# Patient Record
Sex: Female | Born: 1985 | Race: White | Hispanic: No | Smoking: Former smoker
Health system: Southern US, Community
[De-identification: ages and names within clinical notes are randomized; demographics above are authoritative.]

## PROBLEM LIST (undated history)

## (undated) ENCOUNTER — Inpatient Hospital Stay (HOSPITAL_COMMUNITY): Payer: Self-pay

## (undated) DIAGNOSIS — L732 Hidradenitis suppurativa: Secondary | ICD-10-CM

## (undated) DIAGNOSIS — O24419 Gestational diabetes mellitus in pregnancy, unspecified control: Secondary | ICD-10-CM

## (undated) DIAGNOSIS — E282 Polycystic ovarian syndrome: Secondary | ICD-10-CM

## (undated) DIAGNOSIS — Z3490 Encounter for supervision of normal pregnancy, unspecified, unspecified trimester: Principal | ICD-10-CM

## (undated) HISTORY — DX: Gestational diabetes mellitus in pregnancy, unspecified control: O24.419

## (undated) HISTORY — PX: TUBE THORACOTOMY: SHX459

## (undated) HISTORY — DX: Polycystic ovarian syndrome: E28.2

## (undated) HISTORY — DX: Hidradenitis suppurativa: L73.2

## (undated) HISTORY — DX: Encounter for supervision of normal pregnancy, unspecified, unspecified trimester: Z34.90

---
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
## 2003-08-09 ENCOUNTER — Ambulatory Visit (HOSPITAL_COMMUNITY): Admission: RE | Admit: 2003-08-09 | Discharge: 2003-08-09 | Payer: Self-pay | Admitting: Family Medicine

## 2003-08-18 ENCOUNTER — Ambulatory Visit (HOSPITAL_COMMUNITY): Admission: RE | Admit: 2003-08-18 | Discharge: 2003-08-18 | Payer: Self-pay | Admitting: Family Medicine

## 2003-08-30 ENCOUNTER — Emergency Department (HOSPITAL_COMMUNITY): Admission: EM | Admit: 2003-08-30 | Discharge: 2003-08-30 | Payer: Self-pay | Admitting: Emergency Medicine

## 2003-08-31 ENCOUNTER — Ambulatory Visit (HOSPITAL_COMMUNITY): Admission: RE | Admit: 2003-08-31 | Discharge: 2003-08-31 | Payer: Self-pay | Admitting: Obstetrics and Gynecology

## 2003-09-16 ENCOUNTER — Encounter: Admission: RE | Admit: 2003-09-16 | Discharge: 2003-09-16 | Payer: Self-pay | Admitting: General Surgery

## 2004-06-22 ENCOUNTER — Other Ambulatory Visit: Admission: RE | Admit: 2004-06-22 | Discharge: 2004-06-22 | Payer: Self-pay | Admitting: Obstetrics and Gynecology

## 2013-01-27 ENCOUNTER — Ambulatory Visit
Admission: RE | Admit: 2013-01-27 | Discharge: 2013-01-27 | Disposition: A | Discharge status: Home / Self Care | Payer: BC Managed Care – PPO | Source: Ambulatory Visit | Attending: Internal Medicine | Admitting: Internal Medicine

## 2013-01-27 ENCOUNTER — Other Ambulatory Visit: Payer: Self-pay | Admitting: Internal Medicine

## 2013-01-27 DIAGNOSIS — M25572 Pain in left ankle and joints of left foot: Secondary | ICD-10-CM

## 2014-01-04 ENCOUNTER — Other Ambulatory Visit: Payer: Self-pay | Admitting: Family Medicine

## 2014-01-04 ENCOUNTER — Ambulatory Visit
Admission: RE | Admit: 2014-01-04 | Discharge: 2014-01-04 | Disposition: A | Discharge status: Home / Self Care | Payer: No Typology Code available for payment source | Source: Ambulatory Visit | Attending: Family Medicine | Admitting: Family Medicine

## 2014-01-04 DIAGNOSIS — R109 Unspecified abdominal pain: Secondary | ICD-10-CM

## 2014-03-22 ENCOUNTER — Ambulatory Visit
Admission: RE | Admit: 2014-03-22 | Discharge: 2014-03-22 | Disposition: A | Discharge status: Home / Self Care | Payer: No Typology Code available for payment source | Source: Ambulatory Visit | Attending: Family Medicine | Admitting: Family Medicine

## 2014-03-22 ENCOUNTER — Other Ambulatory Visit: Payer: Self-pay | Admitting: Family Medicine

## 2014-03-22 DIAGNOSIS — R109 Unspecified abdominal pain: Secondary | ICD-10-CM

## 2014-03-22 DIAGNOSIS — M549 Dorsalgia, unspecified: Secondary | ICD-10-CM

## 2014-10-21 ENCOUNTER — Encounter: Payer: Self-pay | Admitting: Advanced Practice Midwife

## 2014-10-25 ENCOUNTER — Ambulatory Visit (INDEPENDENT_AMBULATORY_CARE_PROVIDER_SITE_OTHER): Payer: Medicaid Other | Admitting: Adult Health

## 2014-10-25 ENCOUNTER — Encounter: Payer: Self-pay | Admitting: Adult Health

## 2014-10-25 VITALS — BP 110/50 | Ht 63.0 in | Wt 210.5 lb

## 2014-10-25 DIAGNOSIS — R040 Epistaxis: Secondary | ICD-10-CM | POA: Diagnosis not present

## 2014-10-25 DIAGNOSIS — Z331 Pregnant state, incidental: Secondary | ICD-10-CM | POA: Diagnosis not present

## 2014-10-25 DIAGNOSIS — Z3490 Encounter for supervision of normal pregnancy, unspecified, unspecified trimester: Secondary | ICD-10-CM

## 2014-10-25 DIAGNOSIS — O09899 Supervision of other high risk pregnancies, unspecified trimester: Secondary | ICD-10-CM | POA: Insufficient documentation

## 2014-10-25 DIAGNOSIS — I998 Other disorder of circulatory system: Secondary | ICD-10-CM | POA: Diagnosis not present

## 2014-10-25 DIAGNOSIS — R200 Anesthesia of skin: Secondary | ICD-10-CM

## 2014-10-25 HISTORY — DX: Encounter for supervision of normal pregnancy, unspecified, unspecified trimester: Z34.90

## 2014-10-25 MED ORDER — PRENATAL PLUS 27-1 MG PO TABS: 1.0000 | ORAL_TABLET | Freq: Every day | ORAL | Status: DC

## 2014-10-25 NOTE — Progress Notes (Addendum)
Subjective:     Patient ID: Anne [name], female   DOB: 10/16/1985, 29 y.o.   MRN: [id number]  HPI Anne [name] is a 29 year old white female in for fluctuations in BP(80/50-140-90), is pregnant.She had a period in October and spotted til December, was seen in [place] and had US,but not sure how far long she is.She has had some nose bleeds and numbness in 3 fingers right hand,using splint at night.She has history of PCO and 4 miscarriages.She is Therapist, sports at [employer].Has pregnancy medicaid.  Review of Systems See HPI Reviewed past medical,surgical, social and family history. Reviewed medications and allergies.     Objective:   Physical Exam BP 110/50 mmHg  Ht 5\' 3"  (1.6 m)  Wt 210 lb 8 oz (95.482 kg)  BMI 37.30 kg/m2  LMP 07/10/2014 (Approximate)FHR 156-160 by doppler.Had [facility] of 462.9 08/20/14.So could be between 12-[redacted] weeks gestation.Had some labs 1/816 TSH 1.190 and ALT 70.Will get in ASAP for [place] and labs.   Discussed not unusual to have nose bleeds around 19-20 weeks and esp with this dry cold air, and can have lower BP in early pregnancy.  Assessment:     Pregnant     Plan:     Rx prenatal plus #30 1 daily with 11 refills Return in 1 day for dating [place] and PN1 labs with CMP Review handout on second trimester and OB packet given Use saline nose spray and humidifier

## 2014-10-25 NOTE — Patient Instructions (Signed)
Second Trimester of Pregnancy The second trimester is from week 13 through week 28, months 4 through 6. The second trimester is often a time when you feel your best. Your body has also adjusted to being pregnant, and you begin to feel better physically. Usually, morning sickness has lessened or quit completely, you may have more energy, and you may have an increase in appetite. The second trimester is also a time when the fetus is growing rapidly. At the end of the sixth month, the fetus is about 9 inches long and weighs about 1 pounds. You will likely begin to feel the baby move (quickening) between 18 and 20 weeks of the pregnancy. BODY CHANGES Your body goes through many changes during pregnancy. The changes vary from woman to woman.   Your weight will continue to increase. You will notice your lower abdomen bulging out.  You may begin to get stretch marks on your hips, abdomen, and breasts.  You may develop headaches that can be relieved by medicines approved by your health care provider.  You may urinate more often because the fetus is pressing on your bladder.  You may develop or continue to have heartburn as a result of your pregnancy.  You may develop constipation because certain hormones are causing the muscles that push waste through your intestines to slow down.  You may develop hemorrhoids or swollen, bulging veins (varicose veins).  You may have back pain because of the weight gain and pregnancy hormones relaxing your joints between the bones in your pelvis and as a result of a shift in weight and the muscles that support your balance.  Your breasts will continue to grow and be tender.  Your gums may bleed and may be sensitive to brushing and flossing.  Dark spots or blotches (chloasma, mask of pregnancy) may develop on your face. This will likely fade after the baby is born.  A dark line from your belly button to the pubic area (linea nigra) may appear. This will likely fade  after the baby is born.  You may have changes in your hair. These can include thickening of your hair, rapid growth, and changes in texture. Some women also have hair loss during or after pregnancy, or hair that feels dry or thin. Your hair will most likely return to normal after your baby is born. WHAT TO EXPECT AT YOUR PRENATAL VISITS During a routine prenatal visit:  You will be weighed to make sure you and the fetus are growing normally.  Your blood pressure will be taken.  Your abdomen will be measured to track your baby's growth.  The fetal heartbeat will be listened to.  Any test results from the previous visit will be discussed. Your health care provider may ask you:  How you are feeling.  If you are feeling the baby move.  If you have had any abnormal symptoms, such as leaking fluid, bleeding, severe headaches, or abdominal cramping.  If you have any questions. Other tests that may be performed during your second trimester include:  Blood tests that check for:  Low iron levels (anemia).  Gestational diabetes (between 24 and 28 weeks).  Rh antibodies.  Urine tests to check for infections, diabetes, or protein in the urine.  An ultrasound to confirm the proper growth and development of the baby.  An amniocentesis to check for possible genetic problems.  Fetal screens for spina bifida and Down syndrome. HOME CARE INSTRUCTIONS   Avoid all smoking, herbs, alcohol, and unprescribed   drugs. These chemicals affect the formation and growth of the baby.  Follow your health care provider's instructions regarding medicine use. There are medicines that are either safe or unsafe to take during pregnancy.  Exercise only as directed by your health care provider. Experiencing uterine cramps is a good sign to stop exercising.  Continue to eat regular, healthy meals.  Wear a good support bra for breast tenderness.  Do not use hot tubs, steam rooms, or saunas.  Wear your  seat belt at all times when driving.  Avoid raw meat, uncooked cheese, cat litter boxes, and soil used by cats. These carry germs that can cause birth defects in the baby.  Take your prenatal vitamins.  Try taking a stool softener (if your health care provider approves) if you develop constipation. Eat more high-fiber foods, such as fresh vegetables or fruit and whole grains. Drink plenty of fluids to keep your urine clear or pale yellow.  Take warm sitz baths to soothe any pain or discomfort caused by hemorrhoids. Use hemorrhoid cream if your health care provider approves.  If you develop varicose veins, wear support hose. Elevate your feet for 15 minutes, 3-4 times a day. Limit salt in your diet.  Avoid heavy lifting, wear low heel shoes, and practice good posture.  Rest with your legs elevated if you have leg cramps or low back pain.  Visit your dentist if you have not gone yet during your pregnancy. Use a soft toothbrush to brush your teeth and be gentle when you floss.  A sexual relationship may be continued unless your health care provider directs you otherwise.  Continue to go to all your prenatal visits as directed by your health care provider. SEEK MEDICAL CARE IF:   You have dizziness.  You have mild pelvic cramps, pelvic pressure, or nagging pain in the abdominal area.  You have persistent nausea, vomiting, or diarrhea.  You have a bad smelling vaginal discharge.  You have pain with urination. SEEK IMMEDIATE MEDICAL CARE IF:   You have a fever.  You are leaking fluid from your vagina.  You have spotting or bleeding from your vagina.  You have severe abdominal cramping or pain.  You have rapid weight gain or loss.  You have shortness of breath with chest pain.  You notice sudden or extreme swelling of your face, hands, ankles, feet, or legs.  You have not felt your baby move in over an hour.  You have severe headaches that do not go away with  medicine.  You have vision changes. Document Released: 09/18/2001 Document Revised: 09/29/2013 Document Reviewed: 11/25/2012 [facility] Patient Information 2015 [place], [place]. This information is not intended to replace advice given to you by your health care provider. Make sure you discuss any questions you have with your health care provider. Return in 1 day for [place]

## 2014-10-26 ENCOUNTER — Ambulatory Visit (INDEPENDENT_AMBULATORY_CARE_PROVIDER_SITE_OTHER): Payer: Medicaid Other

## 2014-10-26 ENCOUNTER — Other Ambulatory Visit: Payer: Self-pay | Admitting: Adult Health

## 2014-10-26 ENCOUNTER — Other Ambulatory Visit: Payer: Medicaid Other

## 2014-10-26 ENCOUNTER — Encounter: Payer: Self-pay | Admitting: Adult Health

## 2014-10-26 DIAGNOSIS — O2621 Pregnancy care for patient with recurrent pregnancy loss, first trimester: Secondary | ICD-10-CM

## 2014-10-26 DIAGNOSIS — Z3481 Encounter for supervision of other normal pregnancy, first trimester: Secondary | ICD-10-CM

## 2014-10-26 DIAGNOSIS — O26841 Uterine size-date discrepancy, first trimester: Secondary | ICD-10-CM

## 2014-10-26 DIAGNOSIS — Z3682 Encounter for antenatal screening for nuchal translucency: Secondary | ICD-10-CM

## 2014-10-26 DIAGNOSIS — Z331 Pregnant state, incidental: Secondary | ICD-10-CM

## 2014-10-26 DIAGNOSIS — Z0184 Encounter for antibody response examination: Secondary | ICD-10-CM

## 2014-10-26 DIAGNOSIS — Z3490 Encounter for supervision of normal pregnancy, unspecified, unspecified trimester: Secondary | ICD-10-CM

## 2014-10-26 DIAGNOSIS — Z113 Encounter for screening for infections with a predominantly sexual mode of transmission: Secondary | ICD-10-CM

## 2014-10-26 DIAGNOSIS — Z114 Encounter for screening for human immunodeficiency virus [HIV]: Secondary | ICD-10-CM

## 2014-10-26 DIAGNOSIS — Z36 Encounter for antenatal screening of mother: Secondary | ICD-10-CM

## 2014-10-26 LAB — COMPREHENSIVE METABOLIC PANEL
ALT: 47 U/L — ABNORMAL HIGH (ref 0–35)
AST: 32 U/L (ref 0–37)
Albumin: 3.6 g/dL (ref 3.5–5.2)
Alkaline Phosphatase: 47 U/L (ref 39–117)
BUN: 8 mg/dL (ref 6–23)
CO2: 22 mEq/L (ref 19–32)
Calcium: 9 mg/dL (ref 8.4–10.5)
Chloride: 105 mEq/L (ref 96–112)
Creat: 0.55 mg/dL (ref 0.50–1.10)
Glucose, Bld: 77 mg/dL (ref 70–99)
Potassium: 4 mEq/L (ref 3.5–5.3)
Sodium: 136 mEq/L (ref 135–145)
Total Bilirubin: 0.2 mg/dL (ref 0.2–1.2)
Total Protein: 6.4 g/dL (ref 6.0–8.3)

## 2014-10-26 NOTE — Progress Notes (Signed)
U/S-single IUP with +FCA noted, FHR-157 bpm, CRL c/w 14+1wks EDD 04/25/2015,  cx appears closed, bilateral adnexa appears WNL, PT desires NT/IT screen, NB present, NT-1.76mm, anterior Gr 0 placenta

## 2014-10-27 ENCOUNTER — Telehealth: Payer: Self-pay | Admitting: Adult Health

## 2014-10-27 LAB — RPR

## 2014-10-27 LAB — CBC
HCT: 40.6 % (ref 36.0–46.0)
Hemoglobin: 13.4 g/dL (ref 12.0–15.0)
MCH: 30 pg (ref 26.0–34.0)
MCHC: 33 g/dL (ref 30.0–36.0)
MCV: 90.8 fL (ref 78.0–100.0)
MPV: 10.4 fL (ref 8.6–12.4)
Platelets: 246 10*3/uL (ref 150–400)
RBC: 4.47 MIL/uL (ref 3.87–5.11)
RDW: 13.9 % (ref 11.5–15.5)
WBC: 13.2 10*3/uL — ABNORMAL HIGH (ref 4.0–10.5)

## 2014-10-27 LAB — ABO AND RH: Rh Type: POSITIVE

## 2014-10-27 LAB — HEPATITIS B SURFACE ANTIGEN: Hepatitis B Surface Ag: NEGATIVE

## 2014-10-27 LAB — HIV ANTIBODY (ROUTINE TESTING W REFLEX): HIV 1&2 Ab, 4th Generation: NONREACTIVE

## 2014-10-27 LAB — ANTIBODY SCREEN: Antibody Screen: NEGATIVE

## 2014-10-27 LAB — VARICELLA ZOSTER ANTIBODY, IGG: Varicella IgG: 339.5 Index — ABNORMAL HIGH (ref <135.00)

## 2014-10-27 LAB — RUBELLA SCREEN: Rubella: 1.53 Index — ABNORMAL HIGH (ref <0.90)

## 2014-10-27 NOTE — Telephone Encounter (Signed)
Pt aware of LFTs, level better at 47

## 2014-10-29 LAB — US OB COMP + 14 WK

## 2014-11-02 LAB — MATERNAL SCREEN, INTEGRATED #1

## 2014-11-08 ENCOUNTER — Encounter: Payer: Self-pay | Admitting: Women's Health

## 2014-11-08 ENCOUNTER — Ambulatory Visit (INDEPENDENT_AMBULATORY_CARE_PROVIDER_SITE_OTHER): Payer: Medicaid Other | Admitting: Women's Health

## 2014-11-08 VITALS — BP 122/70 | Wt 213.0 lb

## 2014-11-08 DIAGNOSIS — Z1389 Encounter for screening for other disorder: Secondary | ICD-10-CM | POA: Diagnosis not present

## 2014-11-08 DIAGNOSIS — R740 Nonspecific elevation of levels of transaminase and lactic acid dehydrogenase [LDH]: Secondary | ICD-10-CM

## 2014-11-08 DIAGNOSIS — Z0283 Encounter for blood-alcohol and blood-drug test: Secondary | ICD-10-CM

## 2014-11-08 DIAGNOSIS — E282 Polycystic ovarian syndrome: Secondary | ICD-10-CM | POA: Insufficient documentation

## 2014-11-08 DIAGNOSIS — Z331 Pregnant state, incidental: Secondary | ICD-10-CM | POA: Diagnosis not present

## 2014-11-08 DIAGNOSIS — Z118 Encounter for screening for other infectious and parasitic diseases: Secondary | ICD-10-CM

## 2014-11-08 DIAGNOSIS — Z3492 Encounter for supervision of normal pregnancy, unspecified, second trimester: Secondary | ICD-10-CM

## 2014-11-08 DIAGNOSIS — Z1159 Encounter for screening for other viral diseases: Secondary | ICD-10-CM

## 2014-11-08 DIAGNOSIS — R7401 Elevation of levels of liver transaminase levels: Secondary | ICD-10-CM | POA: Insufficient documentation

## 2014-11-08 DIAGNOSIS — Z3682 Encounter for antenatal screening for nuchal translucency: Secondary | ICD-10-CM

## 2014-11-08 DIAGNOSIS — Z363 Encounter for antenatal screening for malformations: Secondary | ICD-10-CM

## 2014-11-08 DIAGNOSIS — Z1371 Encounter for nonprocreative screening for genetic disease carrier status: Secondary | ICD-10-CM

## 2014-11-08 LAB — POCT URINALYSIS DIPSTICK
Blood, UA: NEGATIVE
Glucose, UA: NEGATIVE
Ketones, UA: NEGATIVE
Leukocytes, UA: NEGATIVE
Nitrite, UA: NEGATIVE
Protein, UA: NEGATIVE

## 2014-11-08 NOTE — Progress Notes (Addendum)
Subjective:  Anne [name] is a 29 y.o. G59P0040 Caucasian female at [redacted]w[redacted]d by 14wk u/s, being seen today for her first obstetrical visit.  She had early u/s w/ provider in [place], then transferred to [place]. Her obstetrical history is significant for sab x 4, pcos, elevated ALT (70 @ PCP in early Jan, 47 here 2 wks ago). PCP was thinking elevated ALT could possibly be gallbladder. No pain, n/v at this time. Both of her sisters had GDM. Pregnancy history fully reviewed.  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 Occ HAs. Reflux. Denies vb, cramping, uti s/s, abnormal/malodorous vag d/c, or vulvovaginal itching/irritation.  BP 122/70 mmHg  Wt 213 lb (96.616 kg)  LMP 07/10/2014 (Approximate) Body mass index is 37.74 kg/(m^2).   HISTORY: OB History  Gravida Para Term Preterm AB SAB TAB Ectopic Multiple Living  5    4 4         # Outcome Date GA Lbr Len/2nd Weight Sex Delivery Anes PTL Lv  5 Current           4 SAB           3 SAB           2 SAB           1 SAB              Past Medical History  Diagnosis Date  . PCOS (polycystic ovarian syndrome)   . Hidradenitis suppurativa   . Pregnant 10/25/2014   Past Surgical History  Procedure Laterality Date  . Tube thoracotomy     Family History  Problem Relation Age of Onset  . Cancer Mother     thyroid  . Diabetes Mother   . Hypertension Mother   . Cancer Father     pancreatic  . Gestational diabetes Sister   . Cancer Brother     melonoma on face  . Asthma Maternal Grandmother   . Diabetes Maternal Grandfather   . Other Paternal Grandfather     blood clot  . Gestational diabetes Sister   . Polycystic ovary syndrome Sister     Exam   System:     General: Well developed & nourished, no acute distress   Skin: Warm & dry, normal coloration and turgor, no rashes   Neurologic: Alert & oriented, normal mood   Cardiovascular: Regular rate & rhythm   Respiratory: Effort & rate normal, LCTAB, acyanotic   Abdomen: Soft, non tender   Extremities: normal strength, tone  Thin prep pap smear 2014 @ [facility]: neg  FHR: 158 via doppler   Assessment:   Pregnancy: G5P0040 Patient Active Problem List   Diagnosis Date Noted  . Supervision of normal pregnancy 10/25/2014    Priority: High  . Elevated ALT measurement 11/08/2014  . PCOS (polycystic ovarian syndrome) 11/08/2014    [redacted]w[redacted]d G5P0040 New OB visit PCOS Strong family h/o GDM BMI 37.74 Recent elevated ALT  Plan:  Initial labs drawn at previous visit, will send urine studies today Continue prenatal vitamins Problem list reviewed and updated Reviewed n/v relief measures and warning s/s to report Reviewed recommended weight gain based on pre-gravid BMI Encouraged well-balanced diet Genetic Screening discussed Integrated Screen: 1st IT/NT 2wks ago, needs 2nd IT Cystic fibrosis screening discussed requested Ultrasound discussed; fetal survey: requested Follow up in asap for early 2hr gtt, then 4 weeks for visit and anatomy u/s Go to [facility]  today for 2nd IT and CF (since 1st IT w/ [facility]) CCNC completed Continue wrist splint for suspected carpal tunnel Rt hand Increase fluids, small frequent meals/snacks for headaches TUMS for reflux and reflux info printed Had flu shot at work in Nov 2015  Tawnya Crook CNM, [facility] 11/08/2014 2:41 PM

## 2014-11-08 NOTE — Patient Instructions (Addendum)
You will have your sugar test next visit.  Please do not eat or drink anything after midnight the night before you come, not even water.  You will be here for at least two hours.     Second Trimester of Pregnancy The second trimester is from week 13 through week 28, months 4 through 6. The second trimester is often a time when you feel your best. Your body has also adjusted to being pregnant, and you begin to feel better physically. Usually, morning sickness has lessened or quit completely, you may have more energy, and you may have an increase in appetite. The second trimester is also a time when the fetus is growing rapidly. At the end of the sixth month, the fetus is about 9 inches long and weighs about 1 pounds. You will likely begin to feel the baby move (quickening) between 18 and 20 weeks of the pregnancy. BODY CHANGES Your body goes through many changes during pregnancy. The changes vary from woman to woman.   Your weight will continue to increase. You will notice your lower abdomen bulging out.  You may begin to get stretch marks on your hips, abdomen, and breasts.  You may develop headaches that can be relieved by medicines approved by your health care provider.  You may urinate more often because the fetus is pressing on your bladder.  You may develop or continue to have heartburn as a result of your pregnancy.  You may develop constipation because certain hormones are causing the muscles that push waste through your intestines to slow down.  You may develop hemorrhoids or swollen, bulging veins (varicose veins).  You may have back pain because of the weight gain and pregnancy hormones relaxing your joints between the bones in your pelvis and as a result of a shift in weight and the muscles that support your balance.  Your breasts will continue to grow and be tender.  Your gums may bleed and may be sensitive to brushing and flossing.  Dark spots or blotches (chloasma, mask of  pregnancy) may develop on your face. This will likely fade after the baby is born.  A dark line from your belly button to the pubic area (linea nigra) may appear. This will likely fade after the baby is born.  You may have changes in your hair. These can include thickening of your hair, rapid growth, and changes in texture. Some women also have hair loss during or after pregnancy, or hair that feels dry or thin. Your hair will most likely return to normal after your baby is born. WHAT TO EXPECT AT YOUR PRENATAL VISITS During a routine prenatal visit:  You will be weighed to make sure you and the fetus are growing normally.  Your blood pressure will be taken.  Your abdomen will be measured to track your baby's growth.  The fetal heartbeat will be listened to.  Any test results from the previous visit will be discussed. Your health care provider may ask you:  How you are feeling.  If you are feeling the baby move.  If you have had any abnormal symptoms, such as leaking fluid, bleeding, severe headaches, or abdominal cramping.  If you have any questions. Other tests that may be performed during your second trimester include:  Blood tests that check for:  Low iron levels (anemia).  Gestational diabetes (between 24 and 28 weeks).  Rh antibodies.  Urine tests to check for infections, diabetes, or protein in the urine.  An ultrasound  to confirm the proper growth and development of the baby.  An amniocentesis to check for possible genetic problems.  Fetal screens for spina bifida and Down syndrome. HOME CARE INSTRUCTIONS   Avoid all smoking, herbs, alcohol, and unprescribed drugs. These chemicals affect the formation and growth of the baby.  Follow your health care provider's instructions regarding medicine use. There are medicines that are either safe or unsafe to take during pregnancy.  Exercise only as directed by your health care provider. Experiencing uterine cramps is a  good sign to stop exercising.  Continue to eat regular, healthy meals.  Wear a good support bra for breast tenderness.  Do not use hot tubs, steam rooms, or saunas.  Wear your seat belt at all times when driving.  Avoid raw meat, uncooked cheese, cat litter boxes, and soil used by cats. These carry germs that can cause birth defects in the baby.  Take your prenatal vitamins.  Try taking a stool softener (if your health care provider approves) if you develop constipation. Eat more high-fiber foods, such as fresh vegetables or fruit and whole grains. Drink plenty of fluids to keep your urine clear or pale yellow.  Take warm sitz baths to soothe any pain or discomfort caused by hemorrhoids. Use hemorrhoid cream if your health care provider approves.  If you develop varicose veins, wear support hose. Elevate your feet for 15 minutes, 3-4 times a day. Limit salt in your diet.  Avoid heavy lifting, wear low heel shoes, and practice good posture.  Rest with your legs elevated if you have leg cramps or low back pain.  Visit your dentist if you have not gone yet during your pregnancy. Use a soft toothbrush to brush your teeth and be gentle when you floss.  A sexual relationship may be continued unless your health care provider directs you otherwise.  Continue to go to all your prenatal visits as directed by your health care provider. SEEK MEDICAL CARE IF:   You have dizziness.  You have mild pelvic cramps, pelvic pressure, or nagging pain in the abdominal area.  You have persistent nausea, vomiting, or diarrhea.  You have a bad smelling vaginal discharge.  You have pain with urination. SEEK IMMEDIATE MEDICAL CARE IF:   You have a fever.  You are leaking fluid from your vagina.  You have spotting or bleeding from your vagina.  You have severe abdominal cramping or pain.  You have rapid weight gain or loss.  You have shortness of breath with chest pain.  You notice sudden  or extreme swelling of your face, hands, ankles, feet, or legs.  You have not felt your baby move in over an hour.  You have severe headaches that do not go away with medicine.  You have vision changes. Document Released: 09/18/2001 Document Revised: 09/29/2013 Document Reviewed: 11/25/2012 [facility] Patient Information 2015 [place], [place]. This information is not intended to replace advice given to you by your health care provider. Make sure you discuss any questions you have with your health care provider.  Heartburn During Pregnancy  Heartburn is a burning sensation in the chest caused by stomach acid backing up into the esophagus. Heartburn is common in pregnancy because a certain hormone (progesterone) is released when a woman is pregnant. The progesterone hormone may relax the valve that separates the esophagus from the stomach. This allows acid to go up into the esophagus, causing heartburn. Heartburn may also happen in pregnancy because the enlarging uterus pushes up on the  stomach, which pushes more acid into the esophagus. This is especially true in the later stages of pregnancy. Heartburn problems usually go away after giving birth. CAUSES  Heartburn is caused by stomach acid backing up into the esophagus. During pregnancy, this may result from various things, including:  The progesterone hormone. Changing hormone levels. The growing uterus pushing stomach acid upward. Large meals. Certain foods and drinks. Exercise. Increased acid production. SIGNS AND SYMPTOMS  Burning pain in the chest or lower throat. Bitter taste in the mouth. Coughing. DIAGNOSIS  Your health care provider will typically diagnose heartburn by taking a careful history of your concern. Blood tests may be done to check for a certain type of bacteria that is associated with heartburn. Sometimes, heartburn is diagnosed by prescribing a heartburn medicine to see if the symptoms improve. In some cases, a procedure  called an endoscopy may be done. In this procedure, a tube with a light and a camera on the end (endoscope) is used to examine the esophagus and the stomach. TREATMENT  Treatment will vary depending on the severity of your symptoms. Your health care provider may recommend: Over-the-counter medicines (antacids, acid reducers) for mild heartburn. Prescription medicines to decrease stomach acid or to protect your stomach lining. Certain changes in your diet. Elevating the head of your bed by putting blocks under the legs. This helps prevent stomach acid from backing up into the esophagus when you are lying down. HOME CARE INSTRUCTIONS  Only take over-the-counter or prescription medicines as directed by your health care provider. Raise the head of your bed by putting blocks under the legs if instructed to do so by your health care provider. Sleeping with more pillows is not effective because it only changes the position of your head. Do not exercise right after eating. Avoid eating 2-3 hours before bed. Do not lie down right after eating. Eat small meals throughout the day instead of three large meals. Identify foods and beverages that make your symptoms worse and avoid them. Foods you may want to avoid include: Peppers. Chocolate. High-fat foods, including fried foods. Spicy foods. Garlic and onions. Citrus fruits, including oranges, grapefruit, lemons, and limes. Food containing tomatoes or tomato products. Mint. Carbonated and caffeinated drinks. Vinegar. SEEK MEDICAL CARE IF: You have abdominal pain of any kind. You feel burning in your upper abdomen or chest, especially after eating or lying down. You have nausea and vomiting. Your stomach feels upset after you eat. SEEK IMMEDIATE MEDICAL CARE IF:  You have severe chest pain that goes down your arm or into your jaw or neck. You feel sweaty, dizzy, or light-headed. You become short of breath. You vomit blood. You have difficulty or  pain with swallowing. You have bloody or black, tarry stools. You have episodes of heartburn more than 3 times a week, for more than 2 weeks. MAKE SURE YOU: Understand these instructions. Will watch your condition. Will get help right away if you are not doing well or get worse. Document Released: 09/21/2000 Document Revised: 09/29/2013 Document Reviewed: 05/13/2013 [facility] Patient Information 2015 [place], [place]. This information is not intended to replace advice given to you by your health care provider. Make sure you discuss any questions you have with your health care provider.

## 2014-11-09 LAB — PMP SCREEN PROFILE (10S), URINE
Amphetamine Screen, Ur: NEGATIVE ng/mL
Barbiturate Screen, Ur: NEGATIVE ng/mL
Benzodiazepine Screen, Urine: NEGATIVE ng/mL
Cannabinoids Ur Ql Scn: NEGATIVE ng/mL
Cocaine(Metab.)Screen, Urine: NEGATIVE ng/mL
Creatinine(Crt), U: 163.4 mg/dL (ref 20.0–300.0)
Methadone Scn, Ur: NEGATIVE ng/mL
Opiate Scrn, Ur: NEGATIVE ng/mL
Oxycodone+Oxymorphone Ur Ql Scn: NEGATIVE ng/mL
PCP Scrn, Ur: NEGATIVE ng/mL
Ph of Urine: 5.3 (ref 4.5–8.9)
Propoxyphene, Screen: NEGATIVE ng/mL

## 2014-11-09 LAB — URINALYSIS, ROUTINE W REFLEX MICROSCOPIC
Bilirubin, UA: NEGATIVE
Glucose, UA: NEGATIVE
Ketones, UA: NEGATIVE
Leukocytes, UA: NEGATIVE
Nitrite, UA: NEGATIVE
Protein, UA: NEGATIVE
RBC, UA: NEGATIVE
Specific Gravity, UA: 1.028 (ref 1.005–1.030)
Urobilinogen, Ur: 0.2 mg/dL (ref 0.2–1.0)
pH, UA: 5.5 (ref 5.0–7.5)

## 2014-11-10 LAB — GC/CHLAMYDIA PROBE AMP
CHLAMYDIA, DNA PROBE: NEGATIVE
Neisseria gonorrhoeae by PCR: NEGATIVE

## 2014-11-10 LAB — URINE CULTURE: Organism ID, Bacteria: NO GROWTH

## 2014-11-11 LAB — MATERNAL SCREEN, INTEGRATED #2
AFP MoM: 1.11
AFP, Serum: 27.8 ng/mL
Age risk Down Syndrome: 1:680 {titer}
Calculated Gestational Age: 15.7
Crown Rump Length: 83.1 mm
Estriol Mom: 0.69
Estriol, Free: 0.46 ng/mL
Inhibin A Dimeric: 235 pg/mL
Inhibin A MoM: 1.59
MSS Down Syndrome: <1:5000 {titer}
MSS Trisomy 18 Risk: <1:5000 {titer}
NT MoM: 0.95
Nuchal Translucency: 1.72 mm
Number of fetuses: 1
PAPP-A MoM: 1.71
PAPP-A: 1094 ng/mL
Rish for ONTD: 1:4600 {titer}
hCG MoM: 0.77
hCG, Serum: 23.7 IU/mL

## 2014-11-16 LAB — CYSTIC FIBROSIS DIAGNOSTIC STUDY: Result Summary:: DETECTED — AB

## 2014-11-17 ENCOUNTER — Encounter: Payer: Self-pay | Admitting: Women's Health

## 2014-11-17 ENCOUNTER — Telehealth: Payer: Self-pay | Admitting: Women's Health

## 2014-11-17 DIAGNOSIS — O09899 Supervision of other high risk pregnancies, unspecified trimester: Secondary | ICD-10-CM | POA: Insufficient documentation

## 2014-11-17 DIAGNOSIS — Z141 Cystic fibrosis carrier: Secondary | ICD-10-CM

## 2014-11-17 NOTE — Telephone Encounter (Signed)
Calling to notify pt of +CF carrier status and FOB needs to be tested. No answer and unable to leave voicemail.  Roma Schanz, CNM, WHNP-BC 11/17/2014 1:23 PM

## 2014-11-19 ENCOUNTER — Other Ambulatory Visit: Payer: Medicaid Other

## 2014-11-19 ENCOUNTER — Telehealth: Payer: Self-pay | Admitting: Obstetrics and Gynecology

## 2014-11-19 DIAGNOSIS — Z0184 Encounter for antibody response examination: Secondary | ICD-10-CM

## 2014-11-19 DIAGNOSIS — Z3482 Encounter for supervision of other normal pregnancy, second trimester: Secondary | ICD-10-CM

## 2014-11-19 DIAGNOSIS — Z331 Pregnant state, incidental: Secondary | ICD-10-CM

## 2014-11-19 DIAGNOSIS — Z113 Encounter for screening for infections with a predominantly sexual mode of transmission: Secondary | ICD-10-CM

## 2014-11-19 DIAGNOSIS — Z131 Encounter for screening for diabetes mellitus: Secondary | ICD-10-CM

## 2014-11-19 DIAGNOSIS — Z114 Encounter for screening for human immunodeficiency virus [HIV]: Secondary | ICD-10-CM

## 2014-11-19 NOTE — Telephone Encounter (Signed)
Pt informed will only need the 2 GTT glucose testing for her [facility] appt. Pt verbalized understanding.

## 2014-11-23 ENCOUNTER — Other Ambulatory Visit: Payer: Self-pay | Admitting: Obstetrics & Gynecology

## 2014-11-24 LAB — GLUCOSE TOLERANCE, 2 HOURS W/ 1HR
GLUCOSE, FASTING: 93 mg/dL — AB (ref 65–91)
Glucose, 1 hour: 177 mg/dL (ref 65–179)
Glucose, 2 hour: 72 mg/dL (ref 65–152)

## 2014-11-29 ENCOUNTER — Telehealth: Payer: Self-pay | Admitting: Women's Health

## 2014-11-29 DIAGNOSIS — Z141 Cystic fibrosis carrier: Principal | ICD-10-CM

## 2014-11-29 DIAGNOSIS — O09892 Supervision of other high risk pregnancies, second trimester: Secondary | ICD-10-CM

## 2014-11-29 NOTE — Telephone Encounter (Signed)
Still unsuccessful at trying to reach pt to notify of +CF carrier status. Will notify at next visit on 2/29 if unable to contact prior to then.  Roma Schanz, CNM, WHNP-BC 11/29/2014 2:00 PM

## 2014-11-30 ENCOUNTER — Telehealth: Payer: Self-pay | Admitting: Women's Health

## 2014-11-30 NOTE — Telephone Encounter (Signed)
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  Pt also inquired about GTT and was informed of normal results.

## 2014-12-03 ENCOUNTER — Telehealth: Payer: Self-pay | Admitting: Obstetrics & Gynecology

## 2014-12-03 NOTE — Telephone Encounter (Signed)
Pt states she had pain in right wrist, hand and arm, has improved throughout the day but continues to have numbness, some swelling, no redness or heat. Pt states does have carpal tunnel and has been wearing her arm brace. Pt also stated they were busy at her work place last week and felt the pain could be from "oveuse and repetitive use." Informed pt felt like it was due to the carpal tunnel to continue to wear brace if pain gets worse to call our office back or go to [facility] if our office is closed. Pt verbalized understanding. Pt unable to take tylenol due to elevated LFT's.

## 2014-12-06 ENCOUNTER — Ambulatory Visit (INDEPENDENT_AMBULATORY_CARE_PROVIDER_SITE_OTHER): Payer: Medicaid Other

## 2014-12-06 ENCOUNTER — Encounter: Payer: Self-pay | Admitting: Obstetrics and Gynecology

## 2014-12-06 ENCOUNTER — Other Ambulatory Visit: Payer: Self-pay | Admitting: Women's Health

## 2014-12-06 ENCOUNTER — Ambulatory Visit (INDEPENDENT_AMBULATORY_CARE_PROVIDER_SITE_OTHER): Payer: Medicaid Other | Admitting: Obstetrics and Gynecology

## 2014-12-06 DIAGNOSIS — Z1389 Encounter for screening for other disorder: Secondary | ICD-10-CM

## 2014-12-06 DIAGNOSIS — O2622 Pregnancy care for patient with recurrent pregnancy loss, second trimester: Secondary | ICD-10-CM

## 2014-12-06 DIAGNOSIS — Z363 Encounter for antenatal screening for malformations: Secondary | ICD-10-CM

## 2014-12-06 DIAGNOSIS — Z331 Pregnant state, incidental: Secondary | ICD-10-CM | POA: Diagnosis not present

## 2014-12-06 DIAGNOSIS — Z36 Encounter for antenatal screening of mother: Secondary | ICD-10-CM | POA: Diagnosis not present

## 2014-12-06 DIAGNOSIS — Z3482 Encounter for supervision of other normal pregnancy, second trimester: Secondary | ICD-10-CM

## 2014-12-06 LAB — POCT URINALYSIS DIPSTICK
Blood, UA: NEGATIVE
GLUCOSE UA: NEGATIVE
Ketones, UA: NEGATIVE
LEUKOCYTES UA: NEGATIVE
Nitrite, UA: NEGATIVE
Protein, UA: NEGATIVE

## 2014-12-06 NOTE — Progress Notes (Signed)
U/S(20+0wks)-active fetus, meas c/w dates, fluid WNL, anterior Gr 0 placenta, cx appears closed (3.6cm), bilateral adnexa appears WNL, FHR-163 bpm, no obvious abnl noted, female fetus

## 2014-12-06 NOTE — Progress Notes (Signed)
Patient ID: Anne [name], female   DOB: 1985-10-15, 29 y.o.   MRN: [id number]  [id number] [redacted]w[redacted]d Estimated Date of Delivery: 04/25/15  Blood pressure 110/60, pulse 100, weight 216 lb (97.977 kg), last menstrual period 07/10/2014.   refer to the ob flow sheet for FH and FHR, also BP, Wt, Urine results:notable for negative  Patient denies any bleeding and no rupture of membranes symptoms or regular contractions. Patient complaints: Pt removes 2 episodes of bladder incontinence, intermittent bilateral leg weakness, right shoulder pain and numbness and decreased grip strength of right hand. She also states swelling and increased fluid retention in limbs and gums.   Questions were answered. Assessment:  Right arm paresthesias due to incr fluid. Plan:  Continued routine obstetrical care           Water exercises encouraged.          Avoid excess weight          Adaptive measures at work. Use Dynamap not BP cuff if possible  F/u in q 2 weeks visits for routine care  This chart was scribed for Jonnie Kind, MD by Tula Nakayama, ED Scribe. This patient was seen in room office and the patient's care was started at 4:54 PM.   I personally performed the services described in this documentation, which was SCRIBED in my presence. The recorded information has been reviewed and considered accurate. It has been edited as necessary during review. Jonnie Kind, MD

## 2014-12-06 NOTE — Progress Notes (Signed)
Pt states that she has been having burning pain in her groin areas.

## 2014-12-24 ENCOUNTER — Encounter: Payer: Medicaid Other | Admitting: Obstetrics & Gynecology

## 2014-12-29 ENCOUNTER — Inpatient Hospital Stay (HOSPITAL_COMMUNITY)
Admission: AD | Admit: 2014-12-29 | Discharge: 2014-12-30 | Disposition: A | Discharge status: Home / Self Care | Payer: Medicaid Other | Source: Ambulatory Visit | Attending: Obstetrics and Gynecology | Admitting: Obstetrics and Gynecology

## 2014-12-29 ENCOUNTER — Encounter (HOSPITAL_COMMUNITY): Payer: Self-pay | Admitting: *Deleted

## 2014-12-29 DIAGNOSIS — O368120 Decreased fetal movements, second trimester, not applicable or unspecified: Secondary | ICD-10-CM | POA: Insufficient documentation

## 2014-12-29 DIAGNOSIS — O26859 Spotting complicating pregnancy, unspecified trimester: Secondary | ICD-10-CM | POA: Diagnosis not present

## 2014-12-29 DIAGNOSIS — Z87891 Personal history of nicotine dependence: Secondary | ICD-10-CM | POA: Insufficient documentation

## 2014-12-29 DIAGNOSIS — Z3A23 23 weeks gestation of pregnancy: Secondary | ICD-10-CM | POA: Insufficient documentation

## 2014-12-29 DIAGNOSIS — Z3492 Encounter for supervision of normal pregnancy, unspecified, second trimester: Secondary | ICD-10-CM

## 2014-12-29 LAB — URINALYSIS, ROUTINE W REFLEX MICROSCOPIC
Bilirubin Urine: NEGATIVE
Glucose, UA: NEGATIVE mg/dL
Ketones, ur: 15 mg/dL — AB
LEUKOCYTES UA: NEGATIVE
NITRITE: NEGATIVE
PH: 6 (ref 5.0–8.0)
Protein, ur: NEGATIVE mg/dL
Specific Gravity, Urine: >1.03 — ABNORMAL HIGH (ref 1.005–1.030)
Urobilinogen, UA: 0.2 mg/dL (ref 0.0–1.0)

## 2014-12-29 LAB — WET PREP, GENITAL
Clue Cells Wet Prep HPF POC: NONE SEEN
Trich, Wet Prep: NONE SEEN
Yeast Wet Prep HPF POC: NONE SEEN

## 2014-12-29 LAB — OB RESULTS CONSOLE GC/CHLAMYDIA: Gonorrhea: NEGATIVE

## 2014-12-29 LAB — URINE MICROSCOPIC-ADD ON

## 2014-12-29 NOTE — MAU Provider Note (Signed)
History     CSN: [id number]  Arrival date and time: 12/29/14 2105   First Provider Initiated Contact with Patient 12/29/14 2205     Chief Complaint  Patient presents with  . Vaginal Bleeding  . Decreased Fetal Movement   HPI Mrs. Polidori 29yo G5P0040 at 23 weeks and 2 days presenting with small amount of blood noted on toilet tissue with wiping x2. Believes blood is urethral, but wants to make sure it is not vaginal. Denies dysuria. Notes increased frequency, but states she has been drinking more water. Was told to keep fetal kick counts and baby was not moving as much as the sheet said, however she notes no decrease in movements. Denies LOF. Denies vaginal discharge. Denies contractions.  OB History    Gravida Para Term Preterm AB TAB SAB Ectopic Multiple Living   5    4  4          Past Medical History  Diagnosis Date  . PCOS (polycystic ovarian syndrome)   . Hidradenitis suppurativa   . Pregnant 10/25/2014    Past Surgical History  Procedure Laterality Date  . Tube thoracotomy      Family History  Problem Relation Age of Onset  . Cancer Mother     thyroid  . Diabetes Mother   . Hypertension Mother   . Cancer Father     pancreatic  . Gestational diabetes Sister   . Cancer Brother     melonoma on face  . Asthma Maternal Grandmother   . Diabetes Maternal Grandfather   . Other Paternal Grandfather     blood clot  . Gestational diabetes Sister   . Polycystic ovary syndrome Sister     History  Substance Use Topics  . Smoking status: Former Smoker    Types: Cigarettes  . Smokeless tobacco: Never Used  . Alcohol Use: No    Allergies:  Allergies  Allergen Reactions  . Penicillins Rash    Prescriptions prior to admission  Medication Sig Dispense Refill Last Dose  . prenatal vitamin w/FE, FA (PRENATAL 1 + 1) 27-1 MG TABS tablet Take 1 tablet by mouth daily at 12 noon. 30 each 30 Taking    Review of Systems  Genitourinary: Positive for frequency. Negative  for dysuria.   Physical Exam   Blood pressure 115/56, pulse 91, temperature 98.5 F (36.9 C), temperature source Oral, resp. rate 16, height 5\' 3"  (1.6 m), weight 99.338 kg (219 lb), last menstrual period 07/10/2014, SpO2 99 %.  Physical Exam  Constitutional: She appears well-developed and well-nourished. No distress.  HENT:  Head: Normocephalic and atraumatic.  Cardiovascular: Normal rate and regular rhythm.  Exam reveals no gallop and no friction rub.   No murmur heard. Respiratory: Effort normal. No respiratory distress. She has no wheezes. She has no rales.  GI: Soft. There is no tenderness.  Genitourinary:  Sterile speculum exam without blood or discharge noted.  Musculoskeletal: She exhibits no edema.      MAU Course  Procedures  MDM Fetal Monitor: baseline 150bpm, variability appropriate for gestational age, + accelerations, no decelerations, no contractions Urinalysis:  Ketones 15, small hemoglobin, rare bacteria, negative leukocytes Wet Prep: few WBC GC/Chlamydia: pending  Assessment and Plan  Stable for discharge. Fetal kick counts normal for gestational age; handout given more appropriate after 28 weeks. Follow up at clinic on Tuesday as scheduled. Follow up GC/Chlamydia.   Lorna Few 12/29/2014, 10:06 PM   CNM attestation:  I have seen and examined this  patient; I agree with above documentation in the resident's note.   LETONYA MANGELS is a 29 y.o. [id number] reporting pink d/c with wiping post-void. +FM, denies LOF, VB, contractions  PE: BP 132/69 mmHg  Pulse 94  Temp(Src) 98.5 F (36.9 C) (Oral)  Resp 16  Ht 5\' 3"  (1.6 m)  Wt 99.338 kg (219 lb)  BMI 38.80 kg/m2  SpO2 99%  LMP 07/10/2014 (Approximate) Gen: calm comfortable, NAD Resp: normal effort, no distress Abd: gravid  ROS, labs, PMH reviewed NST reactive   Plan: - preterm labor precautions - increase water intake - continue routine follow up in OB clinic  SHAW, KIMBERLY,  CNM 12:28 AM

## 2014-12-29 NOTE — MAU Note (Signed)
Pink tinge on underwear - started yesterday.  Decreased baby movement according kick count was instructed to do per [facility]. No leaking.

## 2014-12-29 NOTE — MAU Note (Signed)
Pt states she thought she had a UTI, she wiped and saw pink discharge. Pt states she called and was told to come because she had not felt the baby move in 10 kicks in 2 hours.

## 2014-12-30 DIAGNOSIS — O26859 Spotting complicating pregnancy, unspecified trimester: Secondary | ICD-10-CM

## 2014-12-30 LAB — GC/CHLAMYDIA PROBE AMP (~~LOC~~) NOT AT ARMC
CHLAMYDIA, DNA PROBE: NEGATIVE
NEISSERIA GONORRHEA: NEGATIVE

## 2014-12-30 NOTE — Discharge Instructions (Signed)

## 2015-01-04 ENCOUNTER — Encounter: Payer: Self-pay | Admitting: Obstetrics and Gynecology

## 2015-01-04 ENCOUNTER — Ambulatory Visit (INDEPENDENT_AMBULATORY_CARE_PROVIDER_SITE_OTHER): Payer: Medicaid Other | Admitting: Obstetrics and Gynecology

## 2015-01-04 DIAGNOSIS — Z3492 Encounter for supervision of normal pregnancy, unspecified, second trimester: Secondary | ICD-10-CM

## 2015-01-04 DIAGNOSIS — Z1389 Encounter for screening for other disorder: Secondary | ICD-10-CM | POA: Diagnosis not present

## 2015-01-04 DIAGNOSIS — Z331 Pregnant state, incidental: Secondary | ICD-10-CM | POA: Diagnosis not present

## 2015-01-04 LAB — POCT URINALYSIS DIPSTICK
Blood, UA: NEGATIVE
Glucose, UA: NEGATIVE
KETONES UA: NEGATIVE
Leukocytes, UA: NEGATIVE
NITRITE UA: NEGATIVE
Protein, UA: NEGATIVE

## 2015-01-04 NOTE — Progress Notes (Signed)
[id number] [redacted]w[redacted]d Estimated Date of Delivery: 04/25/15  Blood pressure 120/70, pulse 112, weight 220 lb (99.791 kg), last menstrual period 07/10/2014.  Seen in hosp for spotting and decr FM, exam noted fhr as cat I  refer to the ob flow sheet for FH and FHR, also BP, Wt, Urine results:notable for NEGATIVE FORUTI/PROTEIN  Patient reports   good fetal movement, denies any bleeding and no rupture of membranes symptoms or regular contractions. Patient complaints swelling in wrists, wearing splint..  A: routine pregnancy, carpal tunnel sx. Plan: wrist splint, continue kick cts, pt considering water birth and going to classes Questions were answered. Assessment:  Plan:  Continued routine obstetrical care, and water birth classes  F/u in 4  weeks for PN2 labs.

## 2015-01-04 NOTE — Progress Notes (Signed)
Pt states that she is still having trouble with her hands.

## 2015-01-10 ENCOUNTER — Encounter: Payer: Self-pay | Admitting: Obstetrics and Gynecology

## 2015-01-10 ENCOUNTER — Ambulatory Visit (INDEPENDENT_AMBULATORY_CARE_PROVIDER_SITE_OTHER): Payer: Medicaid Other | Admitting: Obstetrics and Gynecology

## 2015-01-10 VITALS — BP 114/50 | HR 100 | Wt 223.0 lb

## 2015-01-10 DIAGNOSIS — M6588 Other synovitis and tenosynovitis, other site: Secondary | ICD-10-CM | POA: Diagnosis not present

## 2015-01-10 DIAGNOSIS — M659 Synovitis and tenosynovitis, unspecified: Secondary | ICD-10-CM

## 2015-01-10 DIAGNOSIS — Z331 Pregnant state, incidental: Secondary | ICD-10-CM

## 2015-01-10 DIAGNOSIS — Z1389 Encounter for screening for other disorder: Secondary | ICD-10-CM

## 2015-01-10 LAB — POCT URINALYSIS DIPSTICK
Glucose, UA: NEGATIVE
Ketones, UA: NEGATIVE
Leukocytes, UA: NEGATIVE
Nitrite, UA: NEGATIVE
Protein, UA: NEGATIVE
RBC UA: NEGATIVE

## 2015-01-10 NOTE — Progress Notes (Addendum)
Patient ID: CHIRSTINA [name], female   DOB: 1986-01-12, 29 y.o.   MRN: [id number]  [id number] [redacted]w[redacted]d Estimated Date of Delivery: 04/25/15  Blood pressure 114/50, pulse 100, weight 223 lb (101.152 kg), last menstrual period 07/10/2014.   refer to the ob flow sheet for FH and FHR, also BP, Wt, Urine results:notable for negative  Patient reports  + good fetal movement, denies any bleeding and no rupture of membranes symptoms or regular contractions.  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 She notes moderate contracture of her right hand 1 day ago. Pt has applied wrist splints to and ice with some relief. She has not tried any other treatment for her pain.  Pt also reports 1 fall that occurred 2 days ago. She notes sudden-onset pain, consistent with sciatic nerve pain, caused her to fall on her right knee. She did not hit or injure her abdomen in the fall. Pt denies numbness, vaginal bleeding and discharge.  Pt is also concerned about a small bulge on her abdomen that appeared in the last few weeks.  Pt is considering breast-feeding. She has prior nipple piercing, but piercings have been removed and no further infections have occurred.   FH- 27 cm FHR-154 bpm Balen's test: Negative Finkelstein test: positive on right.for tenosynovitis Chest clear Cor :RRR w/o murmur  Questions were answered. Assessment: [redacted]w[redacted]d, G5P0040                       Tenosynovitis rt thumb extensor,, will refer to PT          Plan:  Continued routine obstetrical care, discussed treatment options for bilateral wrist pain which includes PT follow-up, [place] therapy and limited work  F/u in 4 weeks for routine pre-natal care   This chart was scribed for Anne Kind, MD by Tula Nakayama, ED Scribe. This patient was seen in room 2 and the patient's care was started at 4:10 PM.   I personally performed the services described in this documentation, which was SCRIBED in my presence.  The recorded information has been reviewed and considered accurate. It has been edited as necessary during review. Anne Kind, MD

## 2015-01-10 NOTE — Progress Notes (Signed)
Pt states that she had a really bad contracture in her right hand on Sunday morning and it took about 30 minutes of massaging the area to get it to release. Pt states that she fell Saturday on her right knee. After the fall she has noticed a bulge in the top, center of her belly.

## 2015-02-01 ENCOUNTER — Encounter: Payer: Self-pay | Admitting: Advanced Practice Midwife

## 2015-02-01 ENCOUNTER — Ambulatory Visit (INDEPENDENT_AMBULATORY_CARE_PROVIDER_SITE_OTHER): Payer: Medicaid Other | Admitting: Advanced Practice Midwife

## 2015-02-01 ENCOUNTER — Other Ambulatory Visit: Payer: Medicaid Other

## 2015-02-01 VITALS — BP 104/50 | HR 100 | Wt 224.0 lb

## 2015-02-01 DIAGNOSIS — Z1389 Encounter for screening for other disorder: Secondary | ICD-10-CM | POA: Diagnosis not present

## 2015-02-01 DIAGNOSIS — O24410 Gestational diabetes mellitus in pregnancy, diet controlled: Secondary | ICD-10-CM

## 2015-02-01 DIAGNOSIS — Z331 Pregnant state, incidental: Secondary | ICD-10-CM

## 2015-02-01 DIAGNOSIS — Z369 Encounter for antenatal screening, unspecified: Secondary | ICD-10-CM

## 2015-02-01 DIAGNOSIS — M659 Synovitis and tenosynovitis, unspecified: Secondary | ICD-10-CM

## 2015-02-01 DIAGNOSIS — Z3493 Encounter for supervision of normal pregnancy, unspecified, third trimester: Secondary | ICD-10-CM | POA: Diagnosis not present

## 2015-02-01 DIAGNOSIS — Z131 Encounter for screening for diabetes mellitus: Secondary | ICD-10-CM

## 2015-02-01 LAB — POCT URINALYSIS DIPSTICK
Blood, UA: NEGATIVE
GLUCOSE UA: NEGATIVE
Ketones, UA: NEGATIVE
Leukocytes, UA: NEGATIVE
Nitrite, UA: NEGATIVE
Protein, UA: NEGATIVE

## 2015-02-01 MED ORDER — METRONIDAZOLE 500 MG PO TABS: 500.0000 mg | ORAL_TABLET | Freq: Two times a day (BID) | ORAL | Status: DC

## 2015-02-01 NOTE — Progress Notes (Signed)
[id number] [redacted]w[redacted]d Estimated Date of Delivery: 04/25/15  Blood pressure 104/50, pulse 100, weight 224 lb (101.606 kg), last menstrual period 07/10/2014.   BP weight and urine results all reviewed and noted.  Please refer to the obstetrical flow sheet for the fundal height and fetal heart rate documentation:  Patient denies any bleeding and no rupture of membranes symptoms or regular contractions. She has felt the baby move ONCE (on 4/14) [place].  Placenta anterior.  FHR 145-162 on doppler.  Movement audible but not palpable.  Patient still wants PT referral for bilateral tenosynovitis.  Wears splints/ice, but now has no feeling in thumbs.  Nurse at [facility]:  Dr. Rockey Situ her to take a week off d/t dropping things. Has persistant fishy odor and thin white dc.  Wet prep showed Bacteria in ED.   All questions were answered.  Plan:  Continued routine obstetrical care, PT referral made.  Rx Flagyl 500mg  BID X 7.  Consider purchasing a home doppler.  PN2 today.  Follow up in 3 weeks for OB appointment,

## 2015-02-01 NOTE — Progress Notes (Signed)
Pt states that she has not felt the baby move since April 14th, pt states that she still has the white discharge.

## 2015-02-02 DIAGNOSIS — O24410 Gestational diabetes mellitus in pregnancy, diet controlled: Secondary | ICD-10-CM | POA: Insufficient documentation

## 2015-02-02 LAB — RPR: RPR Ser Ql: NONREACTIVE

## 2015-02-02 LAB — GLUCOSE TOLERANCE, 2 HOURS W/ 1HR
GLUCOSE, 2 HOUR: 181 mg/dL — AB (ref 65–152)
Glucose, 1 hour: 225 mg/dL — ABNORMAL HIGH (ref 65–179)
Glucose, Fasting: 100 mg/dL — ABNORMAL HIGH (ref 65–91)

## 2015-02-02 LAB — CBC
HEMATOCRIT: 37.7 % (ref 34.0–46.6)
HEMOGLOBIN: 13 g/dL (ref 11.1–15.9)
MCH: 31 pg (ref 26.6–33.0)
MCHC: 34.5 g/dL (ref 31.5–35.7)
MCV: 90 fL (ref 79–97)
Platelets: 236 10*3/uL (ref 150–379)
RBC: 4.19 x10E6/uL (ref 3.77–5.28)
RDW: 13.9 % (ref 12.3–15.4)
WBC: 12.2 10*3/uL — AB (ref 3.4–10.8)

## 2015-02-02 LAB — ANTIBODY SCREEN: Antibody Screen: NEGATIVE

## 2015-02-02 LAB — HSV 2 ANTIBODY, IGG: HSV 2 Glycoprotein G Ab, IgG: <0.91 index (ref 0.00–0.90)

## 2015-02-02 LAB — HIV ANTIBODY (ROUTINE TESTING W REFLEX): HIV SCREEN 4TH GENERATION: NONREACTIVE

## 2015-02-02 NOTE — Addendum Note (Signed)
Addended by: Christin Fudge on: 02/02/2015 02:29 PM   Modules accepted: Orders

## 2015-02-16 ENCOUNTER — Encounter: Payer: Medicaid Other | Attending: Advanced Practice Midwife

## 2015-02-16 VITALS — Ht 63.0 in | Wt 228.7 lb

## 2015-02-16 DIAGNOSIS — O24410 Gestational diabetes mellitus in pregnancy, diet controlled: Secondary | ICD-10-CM | POA: Insufficient documentation

## 2015-02-16 DIAGNOSIS — Z713 Dietary counseling and surveillance: Secondary | ICD-10-CM | POA: Insufficient documentation

## 2015-02-16 NOTE — Progress Notes (Signed)
  Patient was seen on 02/16/15 for Gestational Diabetes self-management . The following learning objectives were met by the patient :   States the definition of Gestational Diabetes  States why dietary management is important in controlling blood glucose  Describes the effects of carbohydrates on blood glucose levels  Demonstrates ability to create a balanced meal plan  Demonstrates carbohydrate counting   States when to check blood glucose levels  Demonstrates proper blood glucose monitoring techniques  States the effect of stress and exercise on blood glucose levels  States the importance of limiting caffeine and abstaining from alcohol and smoking  Plan:  Aim for 2 Carb Choices per meal (30 grams) +/- 1 either way for breakfast Aim for 3 Carb Choices per meal (45 grams) +/- 1 either way from lunch and dinner Aim for 1-2 Carbs per snack Begin reading food labels for Total Carbohydrate and sugar grams of foods Consider  increasing your activity level by walking daily as tolerated Begin checking BG before breakfast and 2 hours after first bit of breakfast, lunch and dinner after  as directed by MD  Take medication  as directed by MD  Blood glucose monitor given: Accu Chek Nano BG Monitoring Kit Lot # [id number] Exp: 10-08-15 Blood glucose reading: 90m/dl  Patient instructed to monitor glucose levels: FBS: 60 - <90 2 hour: <120  Patient received the following handouts:  Nutrition Diabetes and Pregnancy  Carbohydrate Counting List  Meal Planning worksheet  Patient will be seen for follow-up as needed.

## 2015-02-22 ENCOUNTER — Ambulatory Visit (INDEPENDENT_AMBULATORY_CARE_PROVIDER_SITE_OTHER): Payer: Medicaid Other | Admitting: Women's Health

## 2015-02-22 ENCOUNTER — Encounter: Payer: Self-pay | Admitting: Women's Health

## 2015-02-22 VITALS — BP 120/68 | HR 100 | Wt 229.0 lb

## 2015-02-22 DIAGNOSIS — Z331 Pregnant state, incidental: Secondary | ICD-10-CM | POA: Diagnosis not present

## 2015-02-22 DIAGNOSIS — O24410 Gestational diabetes mellitus in pregnancy, diet controlled: Secondary | ICD-10-CM

## 2015-02-22 DIAGNOSIS — O09893 Supervision of other high risk pregnancies, third trimester: Secondary | ICD-10-CM

## 2015-02-22 DIAGNOSIS — Z1389 Encounter for screening for other disorder: Secondary | ICD-10-CM | POA: Diagnosis not present

## 2015-02-22 LAB — POCT URINALYSIS DIPSTICK
Glucose, UA: NEGATIVE
Leukocytes, UA: NEGATIVE
NITRITE UA: NEGATIVE
Protein, UA: NEGATIVE

## 2015-02-22 NOTE — Progress Notes (Signed)
High Risk Pregnancy Diagnosis(es): A1DM G5P0040 48w1dEstimated Date of Delivery: 04/25/15 BP 120/68 mmHg  Pulse 100  Wt 229 lb (103.874 kg)  LMP 07/10/2014 (Approximate)  Urinalysis: unable to void HPI:  Has met w/ dietician, fbs 50-90, 2hr pp 110-140s most are <120 per her report- she did not bring log today.  Recently dx w/ DHarriet Pho& bilateral flexor tenosynovitis- is wearing braces daily, still dropping things a lot States she hasn't heard from PT yet- called them & got her scheduled to go this Thurs @ 1000 Itchy rash under breasts and on abdomen x 7d, got tick off of her Rt thigh 4d ago. No fevers, joint pain, etc.  BP, weight reviewed.  Unable to void. Reports feeling fm better now- feels daily- still not 10/2hr d/t anterior placenta. Denies regular uc's, lof, vb, uti s/s.   Fundal Height:  31 Fetal Heart rate:  150 Edema:  Trace Tick bite area Rt upper thigh: healing well, scabbed over, no bullseye appearance Rash under breast- scarce small red bumps Abdomen: straie w/ possible PUPPs developing- recommended hydrocortisone or benadryl cream, cool compresses  Reviewed ptl s/s, fkc All questions were answered Assessment: 344w1d1DM, de quervain's, flexor tenosynovitis Medication(s) Plans:  None for now Treatment Plan:  U/s @ 38wks unless indicated earlier, deliver @ 40wks if remains A1 Follow up in 1wk for high-risk OB appt to assess sugars

## 2015-02-22 NOTE — Patient Instructions (Addendum)
Physical Therapy Thurs @ 1pm, across from [place]  Call the office [phone]) or go to [facility] if:  You begin to have strong, frequent contractions  Your water breaks.  Sometimes it is a big gush of fluid, sometimes it is just a trickle that keeps getting your panties wet or running down your legs  You have vaginal bleeding.  It is normal to have a small amount of spotting if your cervix was checked.   You don't feel your baby moving like normal.  If you don't, get you something to eat and drink and lay down and focus on feeling your baby move.  You should feel at least 10 movements in 2 hours.  If you don't, you should call the office or go to [facility].    Tdap Vaccine  It is recommended that you get the Tdap vaccine during the third trimester of EACH pregnancy to help protect your baby from getting pertussis (whooping cough)  27-36 weeks is the BEST time to do this so that you can pass the protection on to your baby. During pregnancy is better than after pregnancy, but if you are unable to get it during pregnancy it will be offered at the hospital.   You can get this vaccine at the health department or your family doctor  Everyone who will be around your baby should also be up-to-date on their vaccines. Adults (who are not pregnant) only need 1 dose of Tdap during adulthood.   Third Trimester of Pregnancy The third trimester is from week 29 through week 42, months 7 through 9. The third trimester is a time when the fetus is growing rapidly. At the end of the ninth month, the fetus is about 20 inches in length and weighs 6-10 pounds.  BODY CHANGES Your body goes through many changes during pregnancy. The changes vary from woman to woman.   Your weight will continue to increase. You can expect to gain 25-35 pounds (11-16 kg) by the end of the pregnancy.  You may begin to get stretch marks on your hips, abdomen, and breasts.  You may urinate more often because the fetus  is moving lower into your pelvis and pressing on your bladder.  You may develop or continue to have heartburn as a result of your pregnancy.  You may develop constipation because certain hormones are causing the muscles that push waste through your intestines to slow down.  You may develop hemorrhoids or swollen, bulging veins (varicose veins).  You may have pelvic pain because of the weight gain and pregnancy hormones relaxing your joints between the bones in your pelvis. Backaches may result from overexertion of the muscles supporting your posture.  You may have changes in your hair. These can include thickening of your hair, rapid growth, and changes in texture. Some women also have hair loss during or after pregnancy, or hair that feels dry or thin. Your hair will most likely return to normal after your baby is born.  Your breasts will continue to grow and be tender. A yellow discharge may leak from your breasts called colostrum.  Your belly button may stick out.  You may feel short of breath because of your expanding uterus.  You may notice the fetus "dropping," or moving lower in your abdomen.  You may have a bloody mucus discharge. This usually occurs a few days to a week before labor begins.  Your cervix becomes thin and soft (effaced) near your due date. WHAT TO EXPECT  AT [address] will have prenatal exams every 2 weeks until week 36. Then, you will have weekly prenatal exams. During a routine prenatal visit:  You will be weighed to make sure you and the fetus are growing normally.  Your blood pressure is taken.  Your abdomen will be measured to track your baby's growth.  The fetal heartbeat will be listened to.  Any test results from the previous visit will be discussed.  You may have a cervical check near your due date to see if you have effaced. At around 36 weeks, your caregiver will check your cervix. At the same time, your caregiver will also perform  a test on the secretions of the vaginal tissue. This test is to determine if a type of bacteria, Group B streptococcus, is present. Your caregiver will explain this further. Your caregiver may ask you:  What your birth plan is.  How you are feeling.  If you are feeling the baby move.  If you have had any abnormal symptoms, such as leaking fluid, bleeding, severe headaches, or abdominal cramping.  If you have any questions. Other tests or screenings that may be performed during your third trimester include:  Blood tests that check for low iron levels (anemia).  Fetal testing to check the health, activity level, and growth of the fetus. Testing is done if you have certain medical conditions or if there are problems during the pregnancy. FALSE LABOR You may feel small, irregular contractions that eventually go away. These are called Braxton Hicks contractions, or false labor. Contractions may last for hours, days, or even weeks before true labor sets in. If contractions come at regular intervals, intensify, or become painful, it is best to be seen by your caregiver.  SIGNS OF LABOR   Menstrual-like cramps.  Contractions that are 5 minutes apart or less.  Contractions that start on the top of the uterus and spread down to the lower abdomen and back.  A sense of increased pelvic pressure or back pain.  A watery or bloody mucus discharge that comes from the vagina. If you have any of these signs before the 37th week of pregnancy, call your caregiver right away. You need to go to the hospital to get checked immediately. HOME CARE INSTRUCTIONS   Avoid all smoking, herbs, alcohol, and unprescribed drugs. These chemicals affect the formation and growth of the baby.  Follow your caregiver's instructions regarding medicine use. There are medicines that are either safe or unsafe to take during pregnancy.  Exercise only as directed by your caregiver. Experiencing uterine cramps is a good sign  to stop exercising.  Continue to eat regular, healthy meals.  Wear a good support bra for breast tenderness.  Do not use hot tubs, steam rooms, or saunas.  Wear your seat belt at all times when driving.  Avoid raw meat, uncooked cheese, cat litter boxes, and soil used by cats. These carry germs that can cause birth defects in the baby.  Take your prenatal vitamins.  Try taking a stool softener (if your caregiver approves) if you develop constipation. Eat more high-fiber foods, such as fresh vegetables or fruit and whole grains. Drink plenty of fluids to keep your urine clear or pale yellow.  Take warm sitz baths to soothe any pain or discomfort caused by hemorrhoids. Use hemorrhoid cream if your caregiver approves.  If you develop varicose veins, wear support hose. Elevate your feet for 15 minutes, 3-4 times a day. Limit salt in  your diet.  Avoid heavy lifting, wear low heal shoes, and practice good posture.  Rest a lot with your legs elevated if you have leg cramps or low back pain.  Visit your dentist if you have not gone during your pregnancy. Use a soft toothbrush to brush your teeth and be gentle when you floss.  A sexual relationship may be continued unless your caregiver directs you otherwise.  Do not travel far distances unless it is absolutely necessary and only with the approval of your caregiver.  Take prenatal classes to understand, practice, and ask questions about the labor and delivery.  Make a trial run to the hospital.  Pack your hospital bag.  Prepare the baby's nursery.  Continue to go to all your prenatal visits as directed by your caregiver. SEEK MEDICAL CARE IF:  You are unsure if you are in labor or if your water has broken.  You have dizziness.  You have mild pelvic cramps, pelvic pressure, or nagging pain in your abdominal area.  You have persistent nausea, vomiting, or diarrhea.  You have a bad smelling vaginal discharge.  You have pain  with urination. SEEK IMMEDIATE MEDICAL CARE IF:   You have a fever.  You are leaking fluid from your vagina.  You have spotting or bleeding from your vagina.  You have severe abdominal cramping or pain.  You have rapid weight loss or gain.  You have shortness of breath with chest pain.  You notice sudden or extreme swelling of your face, hands, ankles, feet, or legs.  You have not felt your baby move in over an hour.  You have severe headaches that do not go away with medicine.  You have vision changes. Document Released: 09/18/2001 Document Revised: 09/29/2013 Document Reviewed: 11/25/2012 [facility] Patient Information 2015 [place], [place]. This information is not intended to replace advice given to you by your health care provider. Make sure you discuss any questions you have with your health care provider.

## 2015-02-24 ENCOUNTER — Encounter (HOSPITAL_COMMUNITY): Payer: Self-pay | Admitting: Occupational Therapy

## 2015-02-24 ENCOUNTER — Ambulatory Visit (HOSPITAL_COMMUNITY): Payer: Medicaid Other | Attending: Advanced Practice Midwife | Admitting: Occupational Therapy

## 2015-02-24 DIAGNOSIS — M79645 Pain in left finger(s): Secondary | ICD-10-CM | POA: Diagnosis not present

## 2015-02-24 DIAGNOSIS — M25542 Pain in joints of left hand: Secondary | ICD-10-CM

## 2015-02-24 DIAGNOSIS — M25541 Pain in joints of right hand: Secondary | ICD-10-CM

## 2015-02-24 DIAGNOSIS — M6588 Other synovitis and tenosynovitis, other site: Secondary | ICD-10-CM | POA: Diagnosis not present

## 2015-02-24 DIAGNOSIS — M659 Synovitis and tenosynovitis, unspecified: Secondary | ICD-10-CM

## 2015-02-24 DIAGNOSIS — M79644 Pain in right finger(s): Secondary | ICD-10-CM | POA: Insufficient documentation

## 2015-02-24 NOTE — Therapy (Signed)
[place], [place], [address] Phone: [phone]   Fax:  [phone]  Occupational Therapy Evaluation  Patient Details  Name: Anne [name] MRN: [id number] Date of Birth: 29-Dec-1985 Referring Provider:  Joanette Gula*  Encounter Date: 02/24/2015      OT End of Session - 02/24/15 1346    Visit Number 1   Number of Visits 1   Date for OT Re-Evaluation 04/25/15   Authorization Type Medicaid    OT Start Time 7628   OT Stop Time 1335   OT Time Calculation (min) 30 min   Activity Tolerance Patient tolerated treatment well   Behavior During Therapy [facility] for tasks assessed/performed      Past Medical History  Diagnosis Date  . PCOS (polycystic ovarian syndrome)   . Hidradenitis suppurativa   . Pregnant 10/25/2014  . Gestational diabetes mellitus, antepartum     Past Surgical History  Procedure Laterality Date  . Tube thoracotomy      There were no vitals filed for this visit.  Visit Diagnosis:  Flexor tenosynovitis of thumb  Pain in thumb joint with movement of left hand  Pain in thumb joint with movement of right hand      Subjective Assessment - 02/24/15 1340    Subjective  S: This started when I was around 5 months pregnant   Pertinent History Pt is a 29 y/o female with bilateral flexor tenosynovitis of the thumb.  Pt states she acquired carpal tunnel syndrome at the beginning of her pregnancy and now has flexor tenosynovitis. Pt wears thumb spica splints at night and soft thumb immobilization splints during the day. Dr. Vevelyn Pat referred pt to occupational therapy for evaluation and treament.    Patient Stated Goals To be able to work without pain    Currently in Pain? No/denies           [facility] OT Assessment - 02/24/15 1309    Assessment   Diagnosis Bilateral flexor tenosynovitis of thumb   Onset Date 12/07/14   Balance Screen   Has the patient fallen in the past 6 months  Yes   How many times? 2   Has the patient had a decrease in activity level because of a fear of falling?  No   Is the patient reluctant to leave their home because of a fear of falling?  No   Home  Environment   Family/patient expects to be discharged to: Private residence   Living Arrangements Spouse/significant other   Available Help at Discharge Family   Prior Function   Level of [place] with basic ADLs   ADL   ADL comments Pt reports pain with all B/IADL and work tasks around wrist area and volar forearm.    Written Expression   Dominant Hand Right   Vision - History   Baseline Vision No visual deficits   Cognition   Overall Cognitive Status Within Functional Limits for tasks assessed   Coordination   Other Finkelstein's Test-postive for bilateral thumbs   Coordination WNL   ROM / Strength   AROM / PROM / Strength AROM   AROM   Overall AROM  Within functional limits for tasks performed   Overall AROM Comments All AROM is WNL, however is pain limited   AROM Assessment Site Wrist;Finger            OT Education - 02/24/15 1346    Education provided Yes   Education Details Conservative management strategies,  De Quervain's exercises, Carpal tunnel tendon gliding exercises   Person(s) Educated Patient   Methods Explanation;Demonstration;Handout   Comprehension Verbalized understanding;Returned demonstration          OT Short Term Goals - 02/24/15 1713    OT SHORT TERM GOAL #1   Title Pt will be educated on HEP.    Time 1   Period Days   Status Achieved                  Plan - 02/24/15 1347    Clinical Impression Statement A: Pt is a 29 y/o female presenting with flexor tenosynovitis of bilateral thumbs, causing increased pain and diffculty completing B/IADL and work tasks. Pt is pregnant and reports symptoms began mid-pregnancy, approximately 4-5 months. Pt wears bilateral thumb spica splints at night and soft thumb immobillization  splints during the day. Pt reports symptoms are worse first thing in the morning and later in the evenings. Pt reports symptoms tend to resolve minimally with limited AROM and stretching. Provided pt with and educated pt on carpal tunnel and De Quervain's stretches and AROM exercises, along with conservative management strategies for pain management.    Pt will benefit from skilled therapeutic intervention in order to improve on the following deficits (Retired) Pain;Impaired UE functional use   Rehab Potential Excellent   OT Frequency --  1 time   OT Duration --  1 week   OT Treatment/Interventions Patient/family education   Plan P: Provide pt with strategies on conservative management including education on splint wear, exercises including AROM, tendon gliding, and light strengthening.    Consulted and Agree with Plan of Care Patient        Problem List Patient Active Problem List   Diagnosis Date Noted  . Gestational diabetes mellitus, class A1 02/02/2015  . Flexor tenosynovitis of thumb 01/10/2015  . Cystic fibrosis carrier, antepartum 11/17/2014  . Elevated ALT measurement 11/08/2014  . PCOS (polycystic ovarian syndrome) 11/08/2014  . Supervision of other high-risk pregnancy 10/25/2014    [name], OTR/L  [phone]  02/24/2015, 5:14 PM  [place], [place], [address] Phone: [phone]   Fax:  [phone]

## 2015-03-01 ENCOUNTER — Encounter: Payer: Medicaid Other | Admitting: Women's Health

## 2015-03-03 ENCOUNTER — Ambulatory Visit (INDEPENDENT_AMBULATORY_CARE_PROVIDER_SITE_OTHER): Payer: Medicaid Other | Admitting: Obstetrics & Gynecology

## 2015-03-03 ENCOUNTER — Encounter: Payer: Self-pay | Admitting: Obstetrics & Gynecology

## 2015-03-03 VITALS — BP 100/60 | HR 92 | Wt 231.0 lb

## 2015-03-03 DIAGNOSIS — Z331 Pregnant state, incidental: Secondary | ICD-10-CM | POA: Diagnosis not present

## 2015-03-03 DIAGNOSIS — Z1389 Encounter for screening for other disorder: Secondary | ICD-10-CM | POA: Diagnosis not present

## 2015-03-03 DIAGNOSIS — O09893 Supervision of other high risk pregnancies, third trimester: Secondary | ICD-10-CM | POA: Diagnosis not present

## 2015-03-03 DIAGNOSIS — O24419 Gestational diabetes mellitus in pregnancy, unspecified control: Secondary | ICD-10-CM | POA: Diagnosis not present

## 2015-03-03 LAB — POCT URINALYSIS DIPSTICK
Glucose, UA: NEGATIVE
KETONES UA: NEGATIVE
Leukocytes, UA: NEGATIVE
NITRITE UA: NEGATIVE
PROTEIN UA: NEGATIVE
RBC UA: NEGATIVE

## 2015-03-03 MED ORDER — GLYBURIDE 2.5 MG PO TABS
ORAL_TABLET | ORAL | Status: DC
Start: 2015-03-03 — End: 2015-04-23

## 2015-03-03 MED ORDER — OMEPRAZOLE 20 MG PO CPDR: 20.0000 mg | DELAYED_RELEASE_CAPSULE | Freq: Every day | ORAL | Status: DC

## 2015-03-03 NOTE — Progress Notes (Signed)
Fetal Surveillance Testing today:  None   High Risk Pregnancy Diagnosis(es):   Class AI diabetes, elevated fasting blood sugars, now class A2 diabetes  G5P0040 [redacted]w[redacted]d Estimated Date of Delivery: 04/25/15  Blood pressure 100/60, pulse 92, weight 231 lb (104.781 kg), last menstrual period 07/10/2014.  Urinalysis: Negative   HPI: The patient is being seen today for ongoing management of class AI diabetes. Today she reports CBGs are reviewed and she has consistently high fastings now.  She is also complaining of some sciatica and ongoing carpal tunnel syndrome, she is wearing her brace is and severe heartburn   BP weight and urine results all reviewed and noted. Patient reports good fetal movement, denies any bleeding and no rupture of membranes symptoms or regular contractions.  Fundal Height:  34 Fetal Heart rate:  154 Edema:  Trace  Patient is without complaints other than noted in her HPI. All questions were answered.  All lab and sonogram results have been reviewed. Comments: abnormal: Elevated 2 hour GTT   Assessment:  1.  Pregnancy at [redacted]w[redacted]d,  Estimated Date of Delivery: 04/25/15 :                          2.  Class AI nail A2 diabetes                        3.  Carpal tunnel syndrome  Medication(s) Plans:  Prescription: Prilosec 20 daily, glyburide 2.5 mg at night  Treatment Plan:  We'll start twice weekly testing next week due to progression to A2 diabetes  Follow up in 1 weeks for appointment for high risk OB care, NST

## 2015-03-03 NOTE — Addendum Note (Signed)
Addended by: Farley Ly on: 03/03/2015 11:43 AM   Modules accepted: Orders

## 2015-03-10 ENCOUNTER — Ambulatory Visit (INDEPENDENT_AMBULATORY_CARE_PROVIDER_SITE_OTHER): Payer: Medicaid Other | Admitting: Obstetrics & Gynecology

## 2015-03-10 VITALS — BP 112/62 | HR 80 | Wt 231.0 lb

## 2015-03-10 DIAGNOSIS — O24410 Gestational diabetes mellitus in pregnancy, diet controlled: Secondary | ICD-10-CM

## 2015-03-10 DIAGNOSIS — Z331 Pregnant state, incidental: Secondary | ICD-10-CM | POA: Diagnosis not present

## 2015-03-10 DIAGNOSIS — Z1389 Encounter for screening for other disorder: Secondary | ICD-10-CM | POA: Diagnosis not present

## 2015-03-10 DIAGNOSIS — O24419 Gestational diabetes mellitus in pregnancy, unspecified control: Secondary | ICD-10-CM

## 2015-03-10 DIAGNOSIS — O09893 Supervision of other high risk pregnancies, third trimester: Secondary | ICD-10-CM | POA: Diagnosis not present

## 2015-03-10 LAB — POCT URINALYSIS DIPSTICK
GLUCOSE UA: NEGATIVE
KETONES UA: NEGATIVE
LEUKOCYTES UA: NEGATIVE
NITRITE UA: NEGATIVE
PROTEIN UA: NEGATIVE
RBC UA: NEGATIVE

## 2015-03-10 NOTE — Progress Notes (Signed)
Fetal Surveillance Testing today:  Reactive NST   High Risk Pregnancy Diagnosis(es):   Class A2 DM  G5P0040 [redacted]w[redacted]d Estimated Date of Delivery: 04/25/15  Blood pressure 112/62, pulse 80, weight 231 lb (104.781 kg), last menstrual period 07/10/2014.  Urinalysis: Negative   HPI: The patient is being seen today for ongoing management of Class A2 DM. Today she reports CBG are better on glyburide 2.5 qhs   BP weight and urine results all reviewed and noted. Patient reports good fetal movement, denies any bleeding and no rupture of membranes symptoms or regular contractions.  Fundal Height:  35 Fetal Heart rate:  145 Edema:  1+  Patient is without complaints other than noted in her HPI. All questions were answered.  All lab and sonogram results have been reviewed. Comments: abnormal: 2 hour test   Assessment:  1.  Pregnancy at [redacted]w[redacted]d,  Estimated Date of Delivery: 04/25/15 :                          2.  Class A2 DM                        3.    Medication(s) Plans:  Continue glyburide 2.5 mg hs  Treatment Plan:  Twice weekly surveillance  Follow up in monday weeks for appointment for high risk OB care, NST

## 2015-03-14 ENCOUNTER — Ambulatory Visit (INDEPENDENT_AMBULATORY_CARE_PROVIDER_SITE_OTHER): Payer: Medicaid Other | Admitting: Obstetrics & Gynecology

## 2015-03-14 ENCOUNTER — Encounter: Payer: Self-pay | Admitting: Obstetrics & Gynecology

## 2015-03-14 VITALS — BP 102/60 | HR 92 | Wt 230.0 lb

## 2015-03-14 DIAGNOSIS — Z1389 Encounter for screening for other disorder: Secondary | ICD-10-CM | POA: Diagnosis not present

## 2015-03-14 DIAGNOSIS — O24419 Gestational diabetes mellitus in pregnancy, unspecified control: Secondary | ICD-10-CM | POA: Diagnosis not present

## 2015-03-14 DIAGNOSIS — O09893 Supervision of other high risk pregnancies, third trimester: Secondary | ICD-10-CM

## 2015-03-14 DIAGNOSIS — Z331 Pregnant state, incidental: Secondary | ICD-10-CM | POA: Diagnosis not present

## 2015-03-14 DIAGNOSIS — O24913 Unspecified diabetes mellitus in pregnancy, third trimester: Secondary | ICD-10-CM

## 2015-03-14 LAB — POCT URINALYSIS DIPSTICK
Glucose, UA: NEGATIVE
Ketones, UA: NEGATIVE
Leukocytes, UA: NEGATIVE
NITRITE UA: NEGATIVE
Protein, UA: NEGATIVE

## 2015-03-14 NOTE — Progress Notes (Signed)
Fetal Surveillance Testing today:  Reactive NST   High Risk Pregnancy Diagnosis(es):   Class A2 DM  G5P0040 [redacted]w[redacted]d Estimated Date of Delivery: 04/25/15  Blood pressure 102/60, pulse 92, weight 230 lb (104.327 kg), last menstrual period 07/10/2014.  Urinalysis: Negative   HPI: The patient is being seen today for ongoing management of class a2 dm. Today she reports cbg are good   BP weight and urine results all reviewed and noted. Patient reports good fetal movement, denies any bleeding and no rupture of membranes symptoms or regular contractions.  Fundal Height:  36 Fetal Heart rate:  135 Edema:  none  Patient is without complaints other than noted in her HPI. All questions were answered.  All lab and sonogram results have been reviewed. Comments: abnormal: 2 hour   Assessment:  1.  Pregnancy at [redacted]w[redacted]d,  Estimated Date of Delivery: 04/25/15 :                          2.  Class A2 dm                        3.    Medication(s) Plans:  No changes  Treatment Plan:  Twice weekly assessments  Follow up in thursday weeks for appointment for high risk OB care, NST

## 2015-03-17 ENCOUNTER — Ambulatory Visit (INDEPENDENT_AMBULATORY_CARE_PROVIDER_SITE_OTHER): Payer: Medicaid Other | Admitting: Obstetrics & Gynecology

## 2015-03-17 ENCOUNTER — Encounter: Payer: Self-pay | Admitting: Obstetrics & Gynecology

## 2015-03-17 VITALS — BP 108/70 | HR 76 | Wt 230.0 lb

## 2015-03-17 DIAGNOSIS — O24419 Gestational diabetes mellitus in pregnancy, unspecified control: Secondary | ICD-10-CM

## 2015-03-17 DIAGNOSIS — O09893 Supervision of other high risk pregnancies, third trimester: Secondary | ICD-10-CM

## 2015-03-17 DIAGNOSIS — O24410 Gestational diabetes mellitus in pregnancy, diet controlled: Secondary | ICD-10-CM

## 2015-03-17 DIAGNOSIS — Z331 Pregnant state, incidental: Secondary | ICD-10-CM

## 2015-03-17 DIAGNOSIS — Z1389 Encounter for screening for other disorder: Secondary | ICD-10-CM

## 2015-03-17 LAB — POCT URINALYSIS DIPSTICK
Glucose, UA: NEGATIVE
Ketones, UA: NEGATIVE
Leukocytes, UA: NEGATIVE
Nitrite, UA: NEGATIVE
Protein, UA: NEGATIVE
RBC UA: NEGATIVE

## 2015-03-17 NOTE — Progress Notes (Signed)
Fetal Surveillance Testing today:  Reactive NST   High Risk Pregnancy Diagnosis(es):   Class A2 DM  G5P0040 [redacted]w[redacted]d Estimated Date of Delivery: 04/25/15  Blood pressure 108/70, pulse 76, weight 230 lb (104.327 kg), last menstrual period 07/10/2014.  Urinalysis: Negative   HPI: The patient is being seen today for ongoing management of Class A2 DM. Today she reports CBG are much better on glyburide 2.5 mg qhs   BP weight and urine results all reviewed and noted. Patient reports good fetal movement, denies any bleeding and no rupture of membranes symptoms or regular contractions.  Fundal Height:  37 Fetal Heart rate:  145 Edema:  none  Patient is without complaints other than noted in her HPI. All questions were answered.  All lab and sonogram results have been reviewed. Comments: abnormal: 2 hour   Assessment:  1.  Pregnancy at [redacted]w[redacted]d,  Estimated Date of Delivery: 04/25/15 :                          2.  Class A2 DM                        3.    Medication(s) Plans:  Glyburide 2.5 mg qhs  Treatment Plan:  Twice weekly surveillance  Follow up in monday weeks for appointment for high risk OB care, NST

## 2015-03-21 ENCOUNTER — Encounter: Payer: Self-pay | Admitting: Obstetrics & Gynecology

## 2015-03-21 ENCOUNTER — Ambulatory Visit (INDEPENDENT_AMBULATORY_CARE_PROVIDER_SITE_OTHER): Payer: Medicaid Other | Admitting: Obstetrics & Gynecology

## 2015-03-21 VITALS — BP 110/60 | HR 84 | Wt 229.4 lb

## 2015-03-21 DIAGNOSIS — O09893 Supervision of other high risk pregnancies, third trimester: Secondary | ICD-10-CM

## 2015-03-21 DIAGNOSIS — Z1389 Encounter for screening for other disorder: Secondary | ICD-10-CM | POA: Diagnosis not present

## 2015-03-21 DIAGNOSIS — O24419 Gestational diabetes mellitus in pregnancy, unspecified control: Secondary | ICD-10-CM | POA: Diagnosis not present

## 2015-03-21 DIAGNOSIS — Z331 Pregnant state, incidental: Secondary | ICD-10-CM | POA: Diagnosis not present

## 2015-03-21 LAB — POCT URINALYSIS DIPSTICK
Blood, UA: 2
GLUCOSE UA: NEGATIVE
KETONES UA: NEGATIVE
Leukocytes, UA: NEGATIVE
Nitrite, UA: NEGATIVE

## 2015-03-21 NOTE — Progress Notes (Signed)
Fetal Surveillance Testing today:  Reactive NST   High Risk Pregnancy Diagnosis(es):   Class A2 DM  G5P0040 [redacted]w[redacted]d Estimated Date of Delivery: 04/25/15  Blood pressure 110/60, pulse 84, weight 229 lb 6.4 oz (104.055 kg), last menstrual period 07/10/2014.  Urinalysis: Negative   HPI: The patient is being seen today for ongoing management of Class A2 DM. Today she reports CBG are excellent   BP weight and urine results all reviewed and noted. Patient reports good fetal movement, denies any bleeding and no rupture of membranes symptoms or regular contractions.  Fundal Height:  37 Fetal Heart rate:  145 Edema:  none  Patient is without complaints other than noted in her HPI. All questions were answered.  All lab and sonogram results have been reviewed. Comments: abnormal: 2 hour GTT   Assessment:  1.  Pregnancy at [redacted]w[redacted]d,  Estimated Date of Delivery: 04/25/15 :                          2.  Class A2 DM                        3.    Medication(s) Plans:  Continue glyburide 2.5 mg qhs  Treatment Plan:  Twice weeklu surveillance, EFW end og nest week  Follow up in Thursday NST weeks for appointment for high risk OB care, NST

## 2015-03-25 ENCOUNTER — Ambulatory Visit (INDEPENDENT_AMBULATORY_CARE_PROVIDER_SITE_OTHER): Payer: Medicaid Other | Admitting: Obstetrics & Gynecology

## 2015-03-25 VITALS — BP 100/68 | HR 104 | Wt 230.0 lb

## 2015-03-25 DIAGNOSIS — O09893 Supervision of other high risk pregnancies, third trimester: Secondary | ICD-10-CM | POA: Diagnosis not present

## 2015-03-25 DIAGNOSIS — Z331 Pregnant state, incidental: Secondary | ICD-10-CM

## 2015-03-25 DIAGNOSIS — O24419 Gestational diabetes mellitus in pregnancy, unspecified control: Secondary | ICD-10-CM

## 2015-03-25 DIAGNOSIS — Z1389 Encounter for screening for other disorder: Secondary | ICD-10-CM | POA: Diagnosis not present

## 2015-03-25 LAB — POCT URINALYSIS DIPSTICK
Blood, UA: NEGATIVE
Glucose, UA: NEGATIVE
Ketones, UA: NEGATIVE
Leukocytes, UA: NEGATIVE
NITRITE UA: NEGATIVE

## 2015-03-25 NOTE — Progress Notes (Signed)
Fetal Surveillance Testing today:  Reactive NST   High Risk Pregnancy Diagnosis(es):   Class A2 DM  G5P0040 [redacted]w[redacted]d Estimated Date of Delivery: 04/25/15  Blood pressure 100/68, pulse 104, weight 230 lb (104.327 kg), last menstrual period 07/10/2014.  Urinalysis: Negative   HPI: The patient is being seen today for ongoing management of Class A2 DM. Today she reports carpal tunnel, back ache   BP weight and urine results all reviewed and noted. Patient reports good fetal movement, denies any bleeding and no rupture of membranes symptoms or regular contractions.  Fundal Height:  38 Fetal Heart rate:  140 Edema:  none  Patient is without complaints other than noted in her HPI. All questions were answered.  All lab and sonogram results have been reviewed. Comments: abnormal: 2 hour   Assessment:  1.  Pregnancy at [redacted]w[redacted]d,  Estimated Date of Delivery: 04/25/15 :                          2.  Class A2 DM on glyburide 2.5 qhs                        3.    Medication(s) Plans:  No changes  Treatment Plan:  Twice weekly surveillance  Follow up in tuesday weeks for appointment for high risk OB care, NST, sonogram for EFW 1 week

## 2015-03-29 ENCOUNTER — Other Ambulatory Visit: Payer: Medicaid Other | Admitting: Obstetrics & Gynecology

## 2015-03-31 ENCOUNTER — Other Ambulatory Visit: Payer: Self-pay | Admitting: Obstetrics & Gynecology

## 2015-03-31 ENCOUNTER — Other Ambulatory Visit: Payer: Medicaid Other

## 2015-03-31 DIAGNOSIS — O24410 Gestational diabetes mellitus in pregnancy, diet controlled: Secondary | ICD-10-CM

## 2015-04-01 ENCOUNTER — Ambulatory Visit (INDEPENDENT_AMBULATORY_CARE_PROVIDER_SITE_OTHER): Payer: Medicaid Other

## 2015-04-01 ENCOUNTER — Ambulatory Visit (INDEPENDENT_AMBULATORY_CARE_PROVIDER_SITE_OTHER): Payer: Medicaid Other | Admitting: Obstetrics & Gynecology

## 2015-04-01 ENCOUNTER — Encounter: Payer: Self-pay | Admitting: Obstetrics & Gynecology

## 2015-04-01 VITALS — BP 110/70 | HR 84 | Wt 231.0 lb

## 2015-04-01 DIAGNOSIS — O24419 Gestational diabetes mellitus in pregnancy, unspecified control: Secondary | ICD-10-CM | POA: Diagnosis not present

## 2015-04-01 DIAGNOSIS — Z331 Pregnant state, incidental: Secondary | ICD-10-CM | POA: Diagnosis not present

## 2015-04-01 DIAGNOSIS — O24410 Gestational diabetes mellitus in pregnancy, diet controlled: Secondary | ICD-10-CM

## 2015-04-01 DIAGNOSIS — Z369 Encounter for antenatal screening, unspecified: Secondary | ICD-10-CM

## 2015-04-01 DIAGNOSIS — O09893 Supervision of other high risk pregnancies, third trimester: Secondary | ICD-10-CM | POA: Diagnosis not present

## 2015-04-01 DIAGNOSIS — Z1389 Encounter for screening for other disorder: Secondary | ICD-10-CM

## 2015-04-01 DIAGNOSIS — Z3685 Encounter for antenatal screening for Streptococcus B: Secondary | ICD-10-CM

## 2015-04-01 LAB — POCT URINALYSIS DIPSTICK
Blood, UA: NEGATIVE
GLUCOSE UA: NEGATIVE
Ketones, UA: NEGATIVE
LEUKOCYTES UA: NEGATIVE
Nitrite, UA: NEGATIVE

## 2015-04-01 LAB — OB RESULTS CONSOLE GBS: STREP GROUP B AG: NEGATIVE

## 2015-04-01 NOTE — Progress Notes (Signed)
Fetal Surveillance Testing today:  BPP 8/8, see sonogram report   High Risk Pregnancy Diagnosis(es):   Class A2 DM  G5P0040 [redacted]w[redacted]d Estimated Date of Delivery: 04/25/15  Blood pressure 110/70, pulse 84, weight 231 lb (104.781 kg), last menstrual period 07/10/2014.  Urinalysis: Negative   HPI: The patient is being seen today for ongoing management of class A2 dm. Today she reports CBG are good    BP weight and urine results all reviewed and noted. Patient reports good fetal movement, denies any bleeding and no rupture of membranes symptoms or regular contractions.  Fundal Height:  39 Fetal Heart rate:  145 Edema:  trace  Patient is without complaints other than noted in her HPI. All questions were answered.  All lab and sonogram results have been reviewed. Comments: abnormal: 2 hour   Assessment:  1.  Pregnancy at [redacted]w[redacted]d,  Estimated Date of Delivery: 04/25/15 :                          2.  Class A1 DM                        3.    Medication(s) Plans:  Glyburide 2.5 mg qhs  Treatment Plan:  Twice weekly surveillance  Follow up in tuesday weeks for appointment for high risk OB care,

## 2015-04-01 NOTE — Progress Notes (Signed)
[place] 36+4wks,measurements c/w dates,efw 3084g 59.1%,ant pl gr 3,fht 144bpm,BPP 8/8,adnexa's wnl,afi 13cm

## 2015-04-02 ENCOUNTER — Inpatient Hospital Stay (HOSPITAL_COMMUNITY)
Admission: AD | Admit: 2015-04-02 | Discharge: 2015-04-03 | Disposition: A | Discharge status: Home / Self Care | Payer: Medicaid Other | Source: Ambulatory Visit | Attending: Obstetrics & Gynecology | Admitting: Obstetrics & Gynecology

## 2015-04-02 ENCOUNTER — Inpatient Hospital Stay (HOSPITAL_COMMUNITY): Payer: Medicaid Other

## 2015-04-02 ENCOUNTER — Encounter (HOSPITAL_COMMUNITY): Payer: Self-pay | Admitting: *Deleted

## 2015-04-02 DIAGNOSIS — K8050 Calculus of bile duct without cholangitis or cholecystitis without obstruction: Secondary | ICD-10-CM | POA: Diagnosis not present

## 2015-04-02 DIAGNOSIS — Z87891 Personal history of nicotine dependence: Secondary | ICD-10-CM | POA: Diagnosis not present

## 2015-04-02 DIAGNOSIS — O24913 Unspecified diabetes mellitus in pregnancy, third trimester: Secondary | ICD-10-CM | POA: Diagnosis not present

## 2015-04-02 DIAGNOSIS — O9989 Other specified diseases and conditions complicating pregnancy, childbirth and the puerperium: Secondary | ICD-10-CM | POA: Insufficient documentation

## 2015-04-02 DIAGNOSIS — R1011 Right upper quadrant pain: Secondary | ICD-10-CM | POA: Diagnosis present

## 2015-04-02 DIAGNOSIS — Z3A36 36 weeks gestation of pregnancy: Secondary | ICD-10-CM | POA: Diagnosis not present

## 2015-04-02 DIAGNOSIS — O212 Late vomiting of pregnancy: Secondary | ICD-10-CM | POA: Diagnosis not present

## 2015-04-02 LAB — COMPREHENSIVE METABOLIC PANEL
ALK PHOS: 135 U/L — AB (ref 38–126)
ALT: 13 U/L — AB (ref 14–54)
AST: 17 U/L (ref 15–41)
Albumin: 3.1 g/dL — ABNORMAL LOW (ref 3.5–5.0)
Anion gap: 8 (ref 5–15)
BUN: 13 mg/dL (ref 6–20)
CALCIUM: 9.1 mg/dL (ref 8.9–10.3)
CHLORIDE: 107 mmol/L (ref 101–111)
CO2: 20 mmol/L — AB (ref 22–32)
Creatinine, Ser: 0.69 mg/dL (ref 0.44–1.00)
GFR calc Af Amer: >60 mL/min (ref >60)
GFR calc non Af Amer: >60 mL/min (ref >60)
Glucose, Bld: 101 mg/dL — ABNORMAL HIGH (ref 65–99)
POTASSIUM: 3.9 mmol/L (ref 3.5–5.1)
SODIUM: 135 mmol/L (ref 135–145)
Total Bilirubin: 0.3 mg/dL (ref 0.3–1.2)
Total Protein: 6.5 g/dL (ref 6.5–8.1)

## 2015-04-02 LAB — CBC
HEMATOCRIT: 38 % (ref 36.0–46.0)
HEMOGLOBIN: 13.1 g/dL (ref 12.0–15.0)
MCH: 30 pg (ref 26.0–34.0)
MCHC: 34.5 g/dL (ref 30.0–36.0)
MCV: 87 fL (ref 78.0–100.0)
Platelets: 166 10*3/uL (ref 150–400)
RBC: 4.37 MIL/uL (ref 3.87–5.11)
RDW: 13.6 % (ref 11.5–15.5)
WBC: 13.2 10*3/uL — ABNORMAL HIGH (ref 4.0–10.5)

## 2015-04-02 LAB — LIPASE, BLOOD: Lipase: 15 U/L — ABNORMAL LOW (ref 22–51)

## 2015-04-02 LAB — AMYLASE: AMYLASE: 68 U/L (ref 28–100)

## 2015-04-02 MED ORDER — SODIUM CHLORIDE 0.9 % IV SOLN
8.0000 mg | Freq: Once | INTRAVENOUS | Status: AC
  Administered 2015-04-02: 8 mg via INTRAVENOUS
  Filled 2015-04-02: qty 4

## 2015-04-02 MED ORDER — LACTATED RINGERS IV BOLUS (SEPSIS)
1000.0000 mL | Freq: Once | INTRAVENOUS | Status: AC
  Administered 2015-04-02: 1000 mL via INTRAVENOUS

## 2015-04-02 MED ORDER — MORPHINE SULFATE 4 MG/ML IJ SOLN: 6.0000 mg | INTRAMUSCULAR | Status: DC | PRN

## 2015-04-02 MED ORDER — MORPHINE SULFATE 4 MG/ML IJ SOLN
4.0000 mg | Freq: Once | INTRAMUSCULAR | Status: AC
  Administered 2015-04-02: 4 mg via INTRAVENOUS
  Filled 2015-04-02: qty 1

## 2015-04-02 MED ORDER — METOCLOPRAMIDE HCL 5 MG/ML IJ SOLN
10.0000 mg | Freq: Once | INTRAMUSCULAR | Status: AC
  Administered 2015-04-02: 10 mg via INTRAVENOUS
  Filled 2015-04-02: qty 2

## 2015-04-02 MED ORDER — MORPHINE SULFATE 4 MG/ML IJ SOLN
4.0000 mg | INTRAMUSCULAR | Status: DC | PRN
  Administered 2015-04-02: 4 mg via INTRAVENOUS
  Filled 2015-04-02: qty 1

## 2015-04-02 NOTE — MAU Note (Signed)
Helene Kelp at [facility] MRI department called to inform of patients MRI order. Report given. Patient is able to come over now.

## 2015-04-02 NOTE — MAU Provider Note (Signed)
History     CSN: [id number]  Arrival date and time: 04/02/15 1926   None     Chief Complaint  Patient presents with  . Nausea  . Emesis  . Abdominal Pain   HPI  Patient is 29 y.o. [id number] with A2DM and hx of elevated LFTs [redacted]w[redacted]d here with complaints of RUQ, nausea/vomiting.  Pain since 1700 with nausea/vomiting 30 min after, occurred x 27min yesterday but resolved.  Pain 8/10, sharp, RUQ, worse with movement of baby  +FM, denies LOF, VB, contractions, vaginal discharge.  Past Medical History  Diagnosis Date  . PCOS (polycystic ovarian syndrome)   . Hidradenitis suppurativa   . Pregnant 10/25/2014  . Gestational diabetes mellitus, antepartum     Past Surgical History  Procedure Laterality Date  . Tube thoracotomy      Family History  Problem Relation Age of Onset  . Cancer Mother     thyroid  . Diabetes Mother   . Hypertension Mother   . Cancer Father     pancreatic  . Gestational diabetes Sister   . Cancer Brother     melonoma on face  . Asthma Maternal Grandmother   . Diabetes Maternal Grandfather   . Other Paternal Grandfather     blood clot  . Gestational diabetes Sister   . Polycystic ovary syndrome Sister     History  Substance Use Topics  . Smoking status: Former Smoker    Types: Cigarettes  . Smokeless tobacco: Never Used  . Alcohol Use: No    Allergies:  Allergies  Allergen Reactions  . Penicillins Rash    Prescriptions prior to admission  Medication Sig Dispense Refill Last Dose  . glyBURIDE (DIABETA) 2.5 MG tablet Take 1 at night (Patient taking differently: Take 2.5 mg by mouth at bedtime. Take 1 at night) 30 tablet 3 04/01/2015 at Unknown time  . omeprazole (PRILOSEC) 20 MG capsule Take 1 capsule (20 mg total) by mouth daily. 1 tablet a day 30 capsule 6 04/01/2015 at Unknown time  . prenatal vitamin w/FE, FA (PRENATAL 1 + 1) 27-1 MG TABS tablet Take 1 tablet by mouth daily at 12 noon. 30 each 30 04/02/2015 at Unknown time  . azithromycin  (ZITHROMAX) 250 MG tablet Take 1 tablet by mouth as directed. Z-pack  0 Completed Course at Unknown time  . metroNIDAZOLE (FLAGYL) 500 MG tablet Take 1 tablet (500 mg total) by mouth 2 (two) times daily. (Patient not taking: Reported on 02/22/2015) 14 tablet 0 Completed Course at Unknown time    Review of Systems  Constitutional: Positive for diaphoresis. Negative for fever and chills.  HENT: Negative for congestion.   Respiratory: Negative for cough and shortness of breath.   Cardiovascular: Negative for chest pain and leg swelling.  Gastrointestinal: Positive for nausea, vomiting and abdominal pain. Negative for heartburn and diarrhea.  Genitourinary: Negative for dysuria, urgency, frequency and hematuria.  Skin: Negative for itching and rash.  Neurological: Negative for dizziness, loss of consciousness and headaches.   Physical Exam   Blood pressure 104/82, pulse 104, temperature 97.5 F (36.4 C), temperature source Oral, resp. rate 20, last menstrual period 07/10/2014.  Physical Exam  Constitutional: She is oriented to person, place, and time. She appears well-developed and well-nourished.  HENT:  Head: Normocephalic and atraumatic.  Eyes: Conjunctivae and EOM are normal.  Neck: Normal range of motion.  Cardiovascular: Normal rate.   Respiratory: Effort normal. No respiratory distress.  GI: Soft. Bowel sounds are normal. She exhibits no  distension. There is tenderness (RUQ, neg murphy's). There is guarding. There is no rebound.  Musculoskeletal: Normal range of motion. She exhibits no edema.  Neurological: She is alert and oriented to person, place, and time.  Skin: Skin is warm and dry. No erythema.    MAU Course  Procedures  MDM   Labs: Results for orders placed or performed during the hospital encounter of 04/02/15 (from the past 24 hour(s))  CBC   Collection Time: 04/02/15  8:30 PM  Result Value Ref Range   WBC 13.2 (H) 4.0 - 10.5 K/uL   RBC 4.37 3.87 - 5.11 MIL/uL    Hemoglobin 13.1 12.0 - 15.0 g/dL   HCT 38.0 36.0 - 46.0 %   MCV 87.0 78.0 - 100.0 fL   MCH 30.0 26.0 - 34.0 pg   MCHC 34.5 30.0 - 36.0 g/dL   RDW 13.6 11.5 - 15.5 %   Platelets 166 150 - 400 K/uL  Comprehensive metabolic panel   Collection Time: 04/02/15  8:30 PM  Result Value Ref Range   Sodium 135 135 - 145 mmol/L   Potassium 3.9 3.5 - 5.1 mmol/L   Chloride 107 101 - 111 mmol/L   CO2 20 (L) 22 - 32 mmol/L   Glucose, Bld 101 (H) 65 - 99 mg/dL   BUN 13 6 - 20 mg/dL   Creatinine, Ser 0.69 0.44 - 1.00 mg/dL   Calcium 9.1 8.9 - 10.3 mg/dL   Total Protein 6.5 6.5 - 8.1 g/dL   Albumin 3.1 (L) 3.5 - 5.0 g/dL   AST 17 15 - 41 U/L   ALT 13 (L) 14 - 54 U/L   Alkaline Phosphatase 135 (H) 38 - 126 U/L   Total Bilirubin 0.3 0.3 - 1.2 mg/dL   GFR calc non Af Amer >60 >60 mL/min   GFR calc Af Amer >60 >60 mL/min   Anion gap 8 5 - 15  Lipase, blood   Collection Time: 04/02/15  8:30 PM  Result Value Ref Range   Lipase 15 (L) 22 - 51 U/L  Amylase   Collection Time: 04/02/15  8:30 PM  Result Value Ref Range   Amylase 68 28 - 100 U/L    Peripheral IV, zofran 8mg  IV, morphine 4mg , reglan 10mg  IV, 1L LR bolus   Discussed case with Dr. Zella Richer, advised likely biliary colic.  Proceed with RUQ sono.  If normal then would consider MRI vs CT scan to rule out appendicitis.  However given occurrence of pain yesterday with resolution, appendicitis very unlikely.  9:31 PM   Ultrasound negative for acute cholecystitis, no cholelithiasis, pain now 4/10, nausea/vomiting improved with IV medications.  Will order MRI abdomen to rule out appendicitis. Will increase prn pain medication to 6mg  morphine.  11:02 PM   Symptoms resolved, discussed MRI with radiologist and reported no appendicitis. 3:40 AM   Assessment and Plan  Patient is 29 y.o. [id number] [redacted]w[redacted]d reporting RUQ  likely secondary to biliary colic with normal RUQ sono and normal MRI - f/u at [facility], rx percocet 5/325mg  #10 in event of  subsequent biliary colic pain.  ER warnings discussed.  Liquid diet, advance slowly, low fat diet also advised to prevent further attacks.  ==> symptoms resolved completely prior to discharge, pt currently asymptomatic at 3:41 AM - fetal kick counts reinforced - preterm labor precautions   Jahdiel Krol ROCIO 04/02/2015, 9:08 PM

## 2015-04-02 NOTE — MAU Note (Signed)
[facility] called for MRI transport. Spoke with Danielle to arrange pick up. [facility] will call back for report.

## 2015-04-02 NOTE — MAU Note (Signed)
[facility] arrived for patient transport to [facility] for MRI.

## 2015-04-02 NOTE — MAU Note (Signed)
Report given to [facility]. 

## 2015-04-02 NOTE — MAU Note (Signed)
Dr. Deniece Ree called to verify patient doesn't need EFM for MRI departure. No EFM confirmed. FHR Reactive.

## 2015-04-02 NOTE — MAU Note (Signed)
Patient presents to MAU with c/o nausea and vomiting that started at 5pm. Patient reporting right sided sharp abdominal pain that start at the same time. Patient currently vomiting in triage. Reports being closed at last office visit last week. Denies vaginal bleeding or leaking of fluid at this time. FHR 130-140's. Dr. Deniece Ree made aware of patients arrival and condition; orders pending.

## 2015-04-03 ENCOUNTER — Ambulatory Visit (HOSPITAL_COMMUNITY)
Admission: RE | Admit: 2015-04-03 | Discharge: 2015-04-03 | Disposition: A | Discharge status: Home / Self Care | Payer: Medicaid Other | Source: Ambulatory Visit

## 2015-04-03 ENCOUNTER — Ambulatory Visit (HOSPITAL_COMMUNITY): Payer: Medicaid Other

## 2015-04-03 DIAGNOSIS — K8050 Calculus of bile duct without cholangitis or cholecystitis without obstruction: Secondary | ICD-10-CM | POA: Diagnosis not present

## 2015-04-03 LAB — STREP GP B NAA+RFLX: STREP GP B NAA+RFLX: NEGATIVE

## 2015-04-03 LAB — GC/CHLAMYDIA PROBE AMP
CHLAMYDIA, DNA PROBE: NEGATIVE
Neisseria gonorrhoeae by PCR: NEGATIVE

## 2015-04-03 MED ORDER — OXYCODONE-ACETAMINOPHEN 5-325 MG PO TABS: 1.0000 | ORAL_TABLET | ORAL | Status: DC | PRN

## 2015-04-03 NOTE — Discharge Instructions (Signed)
Biliary Colic  °Biliary colic is a steady or irregular pain in the upper abdomen. It is usually under the right side of the rib cage. It happens when gallstones interfere with the normal flow of bile from the gallbladder. Bile is a liquid that helps to digest fats. Bile is made in the liver and stored in the gallbladder. When you eat a meal, bile passes from the gallbladder through the cystic duct and the common bile duct into the small intestine. There, it mixes with partially digested food. If a gallstone blocks either of these ducts, the normal flow of bile is blocked. The muscle cells in the bile duct contract forcefully to try to move the stone. This causes the pain of biliary colic.  °SYMPTOMS  °· A person with biliary colic usually complains of pain in the upper abdomen. This pain can be: °¨ In the center of the upper abdomen just below the breastbone. °¨ In the upper-right part of the abdomen, near the gallbladder and liver. °¨ Spread back toward the right shoulder blade. °· Nausea and vomiting. °· The pain usually occurs after eating. °· Biliary colic is usually triggered by the digestive system's demand for bile. The demand for bile is high after fatty meals. Symptoms can also occur when a person who has been fasting suddenly eats a very large meal. Most episodes of biliary colic pass after 1 to 5 hours. After the most intense pain passes, your abdomen may continue to ache mildly for about 24 hours. °DIAGNOSIS  °After you describe your symptoms, your caregiver will perform a physical exam. He or she will pay attention to the upper right portion of your belly (abdomen). This is the area of your liver and gallbladder. An ultrasound will help your caregiver look for gallstones. Specialized scans of the gallbladder may also be done. Blood tests may be done, especially if you have fever or if your pain persists. °PREVENTION  °Biliary colic can be prevented by controlling the risk factors for gallstones. Some of  these risk factors, such as heredity, increasing age, and pregnancy are a normal part of life. Obesity and a high-fat diet are risk factors you can change through a healthy lifestyle. Women going through menopause who take hormone replacement therapy (estrogen) are also more likely to develop biliary colic. °TREATMENT  °· Pain medication may be prescribed. °· You may be encouraged to eat a fat-free diet. °· If the first episode of biliary colic is severe, or episodes of colic keep retuning, surgery to remove the gallbladder (cholecystectomy) is usually recommended. This procedure can be done through small incisions using an instrument called a laparoscope. The procedure often requires a brief stay in the hospital. Some people can leave the hospital the same day. It is the most widely used treatment in people troubled by painful gallstones. It is effective and safe, with no complications in more than 90% of cases. °· If surgery cannot be done, medication that dissolves gallstones may be used. This medication is expensive and can take months or years to work. Only small stones will dissolve. °· Rarely, medication to dissolve gallstones is combined with a procedure called shock-wave lithotripsy. This procedure uses carefully aimed shock waves to break up gallstones. In many people treated with this procedure, gallstones form again within a few years. °PROGNOSIS  °If gallstones block your cystic duct or common bile duct, you are at risk for repeated episodes of biliary colic. There is also a 25% chance that you will develop   a gallbladder infection(acute cholecystitis), or some other complication of gallstones within 10 to 20 years. If you have surgery, schedule it at a time that is convenient for you and at a time when you are not sick. °HOME CARE INSTRUCTIONS  °· Drink plenty of clear fluids. °· Avoid fatty, greasy or fried foods, or any foods that make your pain worse. °· Take medications as directed. °SEEK MEDICAL  CARE IF:  °· You develop a fever over 100.5° F (38.1° C). °· Your pain gets worse over time. °· You develop nausea that prevents you from eating and drinking. °· You develop vomiting. °SEEK IMMEDIATE MEDICAL CARE IF:  °· You have continuous or severe belly (abdominal) pain which is not relieved with medications. °· You develop nausea and vomiting which is not relieved with medications. °· You have symptoms of biliary colic and you suddenly develop a fever and shaking chills. This may signal cholecystitis. Call your caregiver immediately. °· You develop a yellow color to your skin or the white part of your eyes (jaundice). °Document Released: 02/25/2006 Document Revised: 12/17/2011 Document Reviewed: 05/06/2008 °ExitCare® Patient Information ©2015 [employer]. This information is not intended to replace advice given to you by your health care provider. Make sure you discuss any questions you have with your health care provider. ° °

## 2015-04-03 NOTE — MAU Note (Signed)
[facility] arrived bringing patient back from MRI

## 2015-04-04 ENCOUNTER — Other Ambulatory Visit: Payer: Self-pay

## 2015-04-05 ENCOUNTER — Ambulatory Visit (INDEPENDENT_AMBULATORY_CARE_PROVIDER_SITE_OTHER): Payer: Medicaid Other | Admitting: Obstetrics & Gynecology

## 2015-04-05 ENCOUNTER — Encounter: Payer: Self-pay | Admitting: Obstetrics & Gynecology

## 2015-04-05 ENCOUNTER — Other Ambulatory Visit: Payer: Medicaid Other

## 2015-04-05 ENCOUNTER — Encounter: Payer: Medicaid Other | Admitting: Obstetrics & Gynecology

## 2015-04-05 VITALS — BP 100/60 | HR 76 | Wt 230.0 lb

## 2015-04-05 DIAGNOSIS — O09893 Supervision of other high risk pregnancies, third trimester: Secondary | ICD-10-CM

## 2015-04-05 DIAGNOSIS — Z1389 Encounter for screening for other disorder: Secondary | ICD-10-CM

## 2015-04-05 DIAGNOSIS — O24419 Gestational diabetes mellitus in pregnancy, unspecified control: Secondary | ICD-10-CM | POA: Diagnosis not present

## 2015-04-05 DIAGNOSIS — Z331 Pregnant state, incidental: Secondary | ICD-10-CM | POA: Diagnosis not present

## 2015-04-05 DIAGNOSIS — K8050 Calculus of bile duct without cholangitis or cholecystitis without obstruction: Secondary | ICD-10-CM

## 2015-04-05 LAB — POCT URINALYSIS DIPSTICK
Glucose, UA: NEGATIVE
LEUKOCYTES UA: NEGATIVE
Nitrite, UA: NEGATIVE

## 2015-04-05 MED ORDER — PROMETHAZINE HCL 25 MG PO TABS: 25.0000 mg | ORAL_TABLET | Freq: Four times a day (QID) | ORAL | Status: DC | PRN

## 2015-04-05 NOTE — Progress Notes (Signed)
  Fetal Surveillance Testing today:  Reactive NST   High Risk Pregnancy Diagnosis(es):   Class A2 DM  G5P0040 [redacted]w[redacted]d Estimated Date of Delivery: 04/25/15  Blood pressure 100/60, pulse 76, weight 230 lb (104.327 kg), last menstrual period 07/10/2014.  Urinalysis: Negative   HPI: The patient is being seen today for ongoing management of Class A2 DM. Today she reports had an episode of biliary colic   BP weight and urine results all reviewed and noted. Patient reports good fetal movement, denies any bleeding and no rupture of membranes symptoms or regular contractions.  Fundal Height:  39 Fetal Heart rate:  150 Edema:  trace  Patient is without complaints other than noted in her HPI. All questions were answered.  All lab and sonogram results have been reviewed. Comments: anormal 2 hour GTT  Assessment:  1.  Pregnancy at [redacted]w[redacted]d,  Estimated Date of Delivery: 04/25/15 :                          2.  Class A2 DM                        3.  Biliary colic  Medication(s) Plans:  Glyburide 2.5 mg qhs  Treatment Plan:  Twice weekly tetsting  Follow up in friday weeks for appointment for high risk OB care, NST

## 2015-04-06 LAB — US OB FOLLOW UP

## 2015-04-08 ENCOUNTER — Other Ambulatory Visit: Payer: Medicaid Other

## 2015-04-08 ENCOUNTER — Encounter: Payer: Medicaid Other | Admitting: Obstetrics & Gynecology

## 2015-04-08 ENCOUNTER — Encounter: Payer: Self-pay | Admitting: Obstetrics & Gynecology

## 2015-04-08 ENCOUNTER — Ambulatory Visit (INDEPENDENT_AMBULATORY_CARE_PROVIDER_SITE_OTHER): Payer: Medicaid Other | Admitting: Obstetrics & Gynecology

## 2015-04-08 VITALS — BP 90/50 | HR 76 | Wt 231.0 lb

## 2015-04-08 DIAGNOSIS — Z1389 Encounter for screening for other disorder: Secondary | ICD-10-CM | POA: Diagnosis not present

## 2015-04-08 DIAGNOSIS — O24419 Gestational diabetes mellitus in pregnancy, unspecified control: Secondary | ICD-10-CM | POA: Diagnosis not present

## 2015-04-08 DIAGNOSIS — Z331 Pregnant state, incidental: Secondary | ICD-10-CM

## 2015-04-08 DIAGNOSIS — O09893 Supervision of other high risk pregnancies, third trimester: Secondary | ICD-10-CM | POA: Diagnosis not present

## 2015-04-08 LAB — POCT URINALYSIS DIPSTICK
GLUCOSE UA: NEGATIVE
LEUKOCYTES UA: NEGATIVE
NITRITE UA: NEGATIVE
PROTEIN UA: NEGATIVE

## 2015-04-08 NOTE — Progress Notes (Signed)
Fetal Surveillance Testing today:  Reactive NST   High Risk Pregnancy Diagnosis(es):   Class A2 DM  G5P0040 [redacted]w[redacted]d Estimated Date of Delivery: 04/25/15  Blood pressure 90/50, pulse 76, weight 231 lb (104.781 kg), last menstrual period 07/10/2014.  Urinalysis: Negative   HPI: The patient is being seen today for ongoing management of Class A2 DM. Today she reports pelvic presure   BP weight and urine results all reviewed and noted. Patient reports good fetal movement, denies any bleeding and no rupture of membranes symptoms or regular contractions.  Fundal Height:  40 Fetal Heart rate:  145 Edema:  none  Patient is without complaints other than noted in her HPI. All questions were answered.  All lab and sonogram results have been reviewed. Comments: abnormal: 2 hour   Assessment:  1.  Pregnancy at [redacted]w[redacted]d,  Estimated Date of Delivery: 04/25/15 :                          2.  Class A2 DM                        3.    Medication(s) Plans:  Glyburide 2.5 mg qhs  Treatment Plan:  Induction 7/11  Follow up in wednesday weeks for appointment for high risk OB care, NST

## 2015-04-08 NOTE — Addendum Note (Signed)
Addended by: Linton Rump on: 04/08/2015 12:51 PM   Modules accepted: Orders

## 2015-04-13 ENCOUNTER — Telehealth (HOSPITAL_COMMUNITY): Payer: Self-pay | Admitting: *Deleted

## 2015-04-13 ENCOUNTER — Ambulatory Visit (INDEPENDENT_AMBULATORY_CARE_PROVIDER_SITE_OTHER): Payer: Medicaid Other | Admitting: Obstetrics and Gynecology

## 2015-04-13 ENCOUNTER — Encounter: Payer: Self-pay | Admitting: Obstetrics and Gynecology

## 2015-04-13 ENCOUNTER — Other Ambulatory Visit: Payer: Medicaid Other

## 2015-04-13 VITALS — BP 90/60 | HR 104 | Wt 231.0 lb

## 2015-04-13 DIAGNOSIS — Z331 Pregnant state, incidental: Secondary | ICD-10-CM | POA: Diagnosis not present

## 2015-04-13 DIAGNOSIS — Z1389 Encounter for screening for other disorder: Secondary | ICD-10-CM

## 2015-04-13 DIAGNOSIS — O09893 Supervision of other high risk pregnancies, third trimester: Secondary | ICD-10-CM | POA: Diagnosis not present

## 2015-04-13 DIAGNOSIS — O24410 Gestational diabetes mellitus in pregnancy, diet controlled: Secondary | ICD-10-CM

## 2015-04-13 LAB — POCT URINALYSIS DIPSTICK
GLUCOSE UA: NEGATIVE
KETONES UA: NEGATIVE
Leukocytes, UA: NEGATIVE
Nitrite, UA: NEGATIVE
Protein, UA: NEGATIVE
RBC UA: NEGATIVE

## 2015-04-13 NOTE — Telephone Encounter (Signed)
Preadmission screen  

## 2015-04-13 NOTE — Progress Notes (Signed)
High Risk Pregnancy Diagnosis(es):   GDM A2 .@[redacted]w[redacted]d    Blood pressure 90/60, pulse 104, weight 231 lb (104.781 kg), last menstrual period 07/10/2014. HPI: [id number] [redacted]w[redacted]d Estimated Date of Delivery: 04/25/15     IOL on 11july scheduled 39 wk     BP weight and urine results reviewed and notable for . Patient reports    good fetal movement, denies any bleeding and no rupture of membranes symptoms or regular contractions .  Fundal Height:  40 Fetal Heart rate:  145 Edema:  1+ ankle Urinalysis: Negative  .Assessment:[redacted]w[redacted]d,   A2 diabetes, here for Last NST before IOL and review of CBG's which are excellent since pt went off liquids for suspected GB disease, and resomed a low CHO diet  Medication(s) Plans:  Glyburide2.5 hs  Treatment Plan:        Continue current tx, with IOL at 39 wk 11 July scheduled Follow up:           4  weeks for  PP check All questions were answered.

## 2015-04-17 NOTE — H&P (Addendum)
Anne [name] is a 29 y.o. female G5P0040 at [redacted]w[redacted]d by 14wk [place] presenting for IOL 2/2 A2DM.  She reports no symptoms.  Maternal Medical History:  Fetal activity: Perceived fetal activity is normal.    Prenatal Complications - Diabetes: gestational. Diabetes is managed by oral agent (monotherapy).       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 PCOS (polycystic ovarian syndrome)   . Hidradenitis suppurativa   . Pregnant 10/25/2014  . Gestational diabetes mellitus, antepartum    Past Surgical History  Procedure Laterality Date  . Tube thoracotomy     Family History: family history includes Asthma in her maternal grandmother; Cancer in her brother, father, and mother; Diabetes in her maternal grandfather and mother; Gestational diabetes in her sister and sister; Hypertension in her mother; Other in her paternal grandfather; Polycystic ovary syndrome in her sister. Social History:  reports that she has quit smoking. Her smoking use included Cigarettes. She has never used smokeless tobacco. She reports that she does not drink alcohol or use illicit drugs.   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  BP 117/64 mmHg  Pulse 85  Temp(Src) 98 F (36.7 C) (Oral)  Resp 20  Ht 5\' 3"  (1.6 m)  Wt 230 lb (104.327 kg)  BMI 40.75 kg/m2  LMP 07/10/2014 (Approximate)  Category I FHR tracing   Exam Physical Exam  Constitutional: She is oriented to person, place, and time. She appears well-developed and well-nourished.  HENT:  Head: Normocephalic and atraumatic.  Eyes: EOM are normal. Pupils are equal, round, and reactive to light.  Neck: Normal range of motion. Neck supple.  Cardiovascular: Normal rate and regular rhythm.   Respiratory: Effort normal and breath sounds normal.  GI: Soft. Bowel sounds are normal.  Genitourinary:  Cervix long/closed/thick  Musculoskeletal: Normal range of motion.  Neurological: She is alert and oriented to person, place, and time. She has normal reflexes.  Skin: Skin is warm and dry.    Prenatal labs: ABO, Rh: A/POS/-- (01/19 1533) Antibody: Negative (04/26 0903) Rubella: 1.53 (01/19 1533) RPR: Non Reactive (04/26 0903)  HBsAg: NEGATIVE (01/19 1533)  HIV: NONREACTIVE (01/19 1533)  GBS:   negative  Assessment/Plan: Anne [name] is a 28 y.o. K5L9767 at [redacted]w[redacted]d here for IOL for A2GDM  #  Labor:  Will proceed with cervical ripening/misoprostol per protocol #Pain: Analgesia as needed, epidural upon request #FWB: Category I #ID:  GBS negative #MOD:  Hopeful for SVD   Virginia Crews, MD, MPH PGY-2,  [facility] 04/17/2015 11:04 PM    Attestation of Attending Supervision of Resident: Evaluation and management procedures were performed by the [facility] Resident under my supervision.  I have seen and examined the patient, reviewed the  resident's note and chart, and I agree with the management and plan.  Verita Schneiders, MD, [place] Attending [place], [facility]

## 2015-04-18 ENCOUNTER — Encounter (HOSPITAL_COMMUNITY): Payer: Self-pay

## 2015-04-18 ENCOUNTER — Inpatient Hospital Stay (HOSPITAL_COMMUNITY)
Admission: RE | Admit: 2015-04-18 | Discharge: 2015-04-23 | DRG: 766 | Disposition: A | Discharge status: Home / Self Care | Payer: Medicaid Other | Source: Ambulatory Visit | Attending: Obstetrics and Gynecology | Admitting: Obstetrics and Gynecology

## 2015-04-18 VITALS — BP 129/71 | HR 88 | Temp 98.0°F | Resp 18 | Ht 63.0 in | Wt 230.0 lb

## 2015-04-18 DIAGNOSIS — O24429 Gestational diabetes mellitus in childbirth, unspecified control: Principal | ICD-10-CM | POA: Diagnosis present

## 2015-04-18 DIAGNOSIS — O24410 Gestational diabetes mellitus in pregnancy, diet controlled: Secondary | ICD-10-CM

## 2015-04-18 DIAGNOSIS — O285 Abnormal chromosomal and genetic finding on antenatal screening of mother: Secondary | ICD-10-CM | POA: Diagnosis present

## 2015-04-18 DIAGNOSIS — O3483 Maternal care for other abnormalities of pelvic organs, third trimester: Secondary | ICD-10-CM | POA: Diagnosis present

## 2015-04-18 DIAGNOSIS — O09893 Supervision of other high risk pregnancies, third trimester: Secondary | ICD-10-CM

## 2015-04-18 DIAGNOSIS — O24419 Gestational diabetes mellitus in pregnancy, unspecified control: Secondary | ICD-10-CM | POA: Diagnosis present

## 2015-04-18 DIAGNOSIS — Z79899 Other long term (current) drug therapy: Secondary | ICD-10-CM

## 2015-04-18 DIAGNOSIS — Z87891 Personal history of nicotine dependence: Secondary | ICD-10-CM

## 2015-04-18 DIAGNOSIS — L732 Hidradenitis suppurativa: Secondary | ICD-10-CM | POA: Diagnosis present

## 2015-04-18 DIAGNOSIS — E282 Polycystic ovarian syndrome: Secondary | ICD-10-CM | POA: Diagnosis present

## 2015-04-18 DIAGNOSIS — O9972 Diseases of the skin and subcutaneous tissue complicating childbirth: Secondary | ICD-10-CM | POA: Diagnosis present

## 2015-04-18 DIAGNOSIS — Z3A39 39 weeks gestation of pregnancy: Secondary | ICD-10-CM | POA: Diagnosis present

## 2015-04-18 LAB — CBC
HEMATOCRIT: 36.7 % (ref 36.0–46.0)
Hemoglobin: 12.4 g/dL (ref 12.0–15.0)
MCH: 29.6 pg (ref 26.0–34.0)
MCHC: 33.8 g/dL (ref 30.0–36.0)
MCV: 87.6 fL (ref 78.0–100.0)
Platelets: 163 10*3/uL (ref 150–400)
RBC: 4.19 MIL/uL (ref 3.87–5.11)
RDW: 14.2 % (ref 11.5–15.5)
WBC: 12.6 10*3/uL — AB (ref 4.0–10.5)

## 2015-04-18 LAB — TYPE AND SCREEN
ABO/RH(D): A POS
Antibody Screen: NEGATIVE

## 2015-04-18 LAB — GLUCOSE, RANDOM: Glucose, Bld: 137 mg/dL — ABNORMAL HIGH (ref 65–99)

## 2015-04-18 LAB — ABO/RH: ABO/RH(D): A POS

## 2015-04-18 LAB — GLUCOSE, CAPILLARY
GLUCOSE-CAPILLARY: 140 mg/dL — AB (ref 65–99)
Glucose-Capillary: 105 mg/dL — ABNORMAL HIGH (ref 65–99)

## 2015-04-18 MED ORDER — OXYTOCIN 40 UNITS IN LACTATED RINGERS INFUSION - SIMPLE MED
1.0000 m[IU]/min | INTRAVENOUS | Status: DC
  Filled 2015-04-18: qty 1000

## 2015-04-18 MED ORDER — ZOLPIDEM TARTRATE 5 MG PO TABS
5.0000 mg | ORAL_TABLET | Freq: Every evening | ORAL | Status: DC | PRN
  Administered 2015-04-19 – 2015-04-20 (×2): 5 mg via ORAL
  Filled 2015-04-18 (×2): qty 1

## 2015-04-18 MED ORDER — ONDANSETRON HCL 4 MG/2ML IJ SOLN
4.0000 mg | Freq: Four times a day (QID) | INTRAMUSCULAR | Status: DC | PRN
  Administered 2015-04-19 – 2015-04-21 (×2): 4 mg via INTRAVENOUS
  Filled 2015-04-18: qty 2

## 2015-04-18 MED ORDER — FLEET ENEMA 7-19 GM/118ML RE ENEM: 1.0000 | ENEMA | RECTAL | Status: DC | PRN

## 2015-04-18 MED ORDER — TERBUTALINE SULFATE 1 MG/ML IJ SOLN: 0.2500 mg | Freq: Once | INTRAMUSCULAR | Status: AC | PRN

## 2015-04-18 MED ORDER — LACTATED RINGERS IV SOLN
500.0000 mL | INTRAVENOUS | Status: DC | PRN
  Administered 2015-04-19: 250 mL via INTRAVENOUS

## 2015-04-18 MED ORDER — ACETAMINOPHEN 325 MG PO TABS
650.0000 mg | ORAL_TABLET | ORAL | Status: DC | PRN
  Administered 2015-04-20: 650 mg via ORAL
  Filled 2015-04-18: qty 2

## 2015-04-18 MED ORDER — OXYCODONE-ACETAMINOPHEN 5-325 MG PO TABS: 1.0000 | ORAL_TABLET | ORAL | Status: DC | PRN

## 2015-04-18 MED ORDER — OXYTOCIN 40 UNITS IN LACTATED RINGERS INFUSION - SIMPLE MED: 62.5000 mL/h | INTRAVENOUS | Status: DC

## 2015-04-18 MED ORDER — OXYTOCIN BOLUS FROM INFUSION
500.0000 mL | INTRAVENOUS | Status: DC
Start: 2015-04-18 — End: 2015-04-21

## 2015-04-18 MED ORDER — MISOPROSTOL 50MCG HALF TABLET
50.0000 ug | ORAL_TABLET | ORAL | Status: DC | PRN
Start: 2015-04-18 — End: 2015-04-19
  Administered 2015-04-18 (×3): 50 ug via ORAL
  Filled 2015-04-18 (×3): qty 0.5

## 2015-04-18 MED ORDER — OXYCODONE-ACETAMINOPHEN 5-325 MG PO TABS: 2.0000 | ORAL_TABLET | ORAL | Status: DC | PRN

## 2015-04-18 MED ORDER — CITRIC ACID-SODIUM CITRATE 334-500 MG/5ML PO SOLN
30.0000 mL | ORAL | Status: DC | PRN
  Administered 2015-04-18 – 2015-04-21 (×2): 30 mL via ORAL
  Filled 2015-04-18 (×2): qty 15

## 2015-04-18 MED ORDER — LIDOCAINE HCL (PF) 1 % IJ SOLN: 30.0000 mL | INTRAMUSCULAR | Status: DC | PRN

## 2015-04-18 MED ORDER — LACTATED RINGERS IV SOLN
INTRAVENOUS | Status: DC
  Administered 2015-04-18 – 2015-04-21 (×7): via INTRAVENOUS

## 2015-04-18 NOTE — Progress Notes (Signed)
Anne [name] is a 29 y.o. H6F7903 at [redacted]w[redacted]d admitted for induction of labor due to A2 Gestational diabetes.  Subjective: Doing well  Objective: BP 117/64 mmHg  Pulse 85  Temp(Src) 98 F (36.7 C) (Oral)  Resp 20  Ht 5\' 3"  (1.6 m)  Wt 230 lb (104.327 kg)  BMI 40.75 kg/m2  LMP 07/10/2014 (Approximate)     FHT:  FHR: 135 bpm, variability: moderate,  accelerations:  Present,  decelerations:  Absent UC:   irregular, every 3-6 minutes SVE:   Dilation: Closed Effacement (%): Thick Station: -3 Exam by:: Yvonne Kendall, CNM  Labs: Lab Results  Component Value Date   WBC 12.6* 04/18/2015   HGB 12.4 04/18/2015   HCT 36.7 04/18/2015   MCV 87.6 04/18/2015   PLT 163 04/18/2015    Assessment / Plan: Induction of labor due to gestational diabetes,  undergoing cervical ripening  Labor: Progressing normally, continue cervical ripening for now Fetal Wellbeing:  Category I Pain Control:  Labor support without medications Anticipated MOD:  NSVD  Ritesh Opara A, MD 04/18/2015, 4:35 PM

## 2015-04-18 NOTE — Progress Notes (Signed)
Anne [name] is a 29 y.o. Y0V3710 at [redacted]w[redacted]d by ultrasound admitted for A2DM  Subjective:  Up in rocking chair. No complaints. Mild contractions Objective: BP 116/74 mmHg  Pulse 87  Temp(Src) 98.7 F (37.1 C) (Oral)  Resp 20  Ht 5\' 3"  (1.6 m)  Wt 104.327 kg (230 lb)  BMI 40.75 kg/m2  LMP 07/10/2014 (Approximate)      FHT:  FHR: 150 bpm, variability: moderate,  accelerations:  Present,  decelerations:  Absent UC:   irregular, every 3 minutes SVE:   Dilation: Closed Effacement (%): Thick Station: -3 Exam by:: L Clemmons CNM  Labs: Lab Results  Component Value Date   WBC 12.6* 04/18/2015   HGB 12.4 04/18/2015   HCT 36.7 04/18/2015   MCV 87.6 04/18/2015   PLT 163 04/18/2015    Assessment / Plan: Induction of labor due to A2DM,  progressing well on pitocin  Labor: Cytotec Preeclampsia:   Fetal Wellbeing:  Category I Pain Control:  Labor support without medications I/D:  GBS negative Anticipated MOD:  NSVD  Clemmons,Lori Grissett 04/18/2015, 8:11 PM

## 2015-04-19 LAB — GLUCOSE, CAPILLARY
GLUCOSE-CAPILLARY: 146 mg/dL — AB (ref 65–99)
Glucose-Capillary: 99 mg/dL (ref 65–99)
Glucose-Capillary: 114 mg/dL — ABNORMAL HIGH (ref 65–99)
Glucose-Capillary: 129 mg/dL — ABNORMAL HIGH (ref 65–99)

## 2015-04-19 LAB — HIV ANTIBODY (ROUTINE TESTING W REFLEX): HIV SCREEN 4TH GENERATION: NONREACTIVE

## 2015-04-19 LAB — RPR: RPR: NONREACTIVE

## 2015-04-19 MED ORDER — FENTANYL CITRATE (PF) 100 MCG/2ML IJ SOLN
100.0000 ug | INTRAMUSCULAR | Status: DC | PRN
  Administered 2015-04-19 (×2): 100 ug via INTRAVENOUS
  Filled 2015-04-19 (×2): qty 2

## 2015-04-19 MED ORDER — MISOPROSTOL 25 MCG QUARTER TABLET
25.0000 ug | ORAL_TABLET | ORAL | Status: DC | PRN
  Administered 2015-04-19 – 2015-04-20 (×4): 25 ug via VAGINAL
  Filled 2015-04-19 (×6): qty 0.25

## 2015-04-19 NOTE — Progress Notes (Signed)
Patient ID: Anne [name], female   DOB: [birth year], 29 y.o.   MRN: [id number] Doing well.  Filed Vitals:   04/18/15 1646 04/18/15 1800 04/18/15 2117 04/19/15 0021  BP: 116/74  122/65 123/63  Pulse: 87  99 79  Temp: 98.7 F (37.1 C)  97.4 F (36.3 C) 97.8 F (36.6 C)  TempSrc: Oral  Oral Oral  Resp: 20 20 18 18   Height:      Weight:       FHR reactive UC s every 2-4 min  Dilation: Fingertip Effacement (%): 10 Cervical Position: Posterior Station: -3 Presentation: Undeterminable Exam by:: Cristal Generous RN  I had the RN change her Cytotec  From PO  To Vaginal to see if it would be more effective It looks like there has been more uterine activity  Will continue plan of care

## 2015-04-19 NOTE — Progress Notes (Signed)
Labor Progress Note  S: Very uncomfortable with FB, not feeling ctx regularly    O:  BP 118/70 mmHg  Pulse 79  Temp(Src) 98.2 F (36.8 C) (Oral)  Resp 18  Ht 5\' 3"  (1.6 m)  Wt 230 lb (104.327 kg)  BMI 40.75 kg/m2  LMP 07/10/2014 (Approximate) Cat II: baseline 145 bpm, good variability, + accels, occ variable decels CVE: FB still in place   A&P: 29 y.o. [id number] [redacted]w[redacted]d  #labor: will give another dose of cytotec,  #pain: consider nubain and phenergan to assist with pain & to help pt rest    Melina Schools, DO 4:05 PM

## 2015-04-19 NOTE — Progress Notes (Signed)
Patient ID: Anne [name], female   DOB: [birth year], 29 y.o.   MRN: [id number] Doing well, feeling some contractions more than others  Filed Vitals:   04/18/15 1800 04/18/15 2117 04/19/15 0021 04/19/15 0421  BP:  122/65 123/63 118/71  Pulse:  99 79 81  Temp:  97.4 F (36.3 C) 97.8 F (36.6 C) 98.2 F (36.8 C)  TempSrc:  Oral Oral Oral  Resp: 20 18 18 18   Height:      Weight:       Results for Anne [name], Anne [name] (MRN [id number]) as of 04/19/2015 06:46  Ref. Range 04/18/2015 14:36 04/18/2015 20:46 04/19/2015 02:30  Glucose-Capillary Latest Ref Range: 65-99 mg/dL 105 (H) 140 (H) 99   FHR reactive  UCs every 2-3 min  Dilation: 1 Effacement (%): 80 Cervical Position: Posterior Station: -3 Presentation: Undeterminable Exam by:: Hansel Feinstein CNM  Foley bulb inserted and inflated to 60cc

## 2015-04-19 NOTE — Progress Notes (Signed)
Patient ID: Anne [name], female   DOB: 26-Feb-1986, 29 y.o.   MRN: [id number]  Doing well, comfortable  Filed Vitals:   04/19/15 1700 04/19/15 1809 04/19/15 1939 04/19/15 2110  BP:  119/70 118/58 119/76  Pulse:  90 99 105  Temp:  98.8 F (37.1 C) 98.5 F (36.9 C)   TempSrc:  Oral Oral   Resp: 18 16    Height:      Weight:       FHR reactive UCs not tracing  Foley balloon still in place  Will continue PV Cytotec until cervix more effaced

## 2015-04-19 NOTE — Progress Notes (Signed)
Pt complaining of discomfort with SVEs and extreme pain with Foley Bulb, requesting to speak with MD about concerns

## 2015-04-20 LAB — GLUCOSE, CAPILLARY
GLUCOSE-CAPILLARY: 108 mg/dL — AB (ref 65–99)
GLUCOSE-CAPILLARY: 82 mg/dL (ref 65–99)
GLUCOSE-CAPILLARY: 97 mg/dL (ref 65–99)
Glucose-Capillary: 117 mg/dL — ABNORMAL HIGH (ref 65–99)
Glucose-Capillary: 141 mg/dL — ABNORMAL HIGH (ref 65–99)
Glucose-Capillary: 153 mg/dL — ABNORMAL HIGH (ref 65–99)

## 2015-04-20 MED ORDER — TERBUTALINE SULFATE 1 MG/ML IJ SOLN: 0.2500 mg | Freq: Once | INTRAMUSCULAR | Status: AC | PRN

## 2015-04-20 MED ORDER — OXYTOCIN 40 UNITS IN LACTATED RINGERS INFUSION - SIMPLE MED
1.0000 m[IU]/min | INTRAVENOUS | Status: DC
  Administered 2015-04-20: 2 m[IU]/min via INTRAVENOUS
  Administered 2015-04-20: 10 m[IU]/min via INTRAVENOUS
  Administered 2015-04-21: 2 m[IU]/min via INTRAVENOUS

## 2015-04-20 MED ORDER — GLYBURIDE 2.5 MG PO TABS
2.5000 mg | ORAL_TABLET | Freq: Once | ORAL | Status: AC
  Administered 2015-04-20: 2.5 mg via ORAL
  Filled 2015-04-20: qty 1

## 2015-04-20 NOTE — Progress Notes (Signed)
Anne [name] is a 29 y.o. [id number] at [redacted]w[redacted]d by LMP admitted for induction of labor due to Gestational diabetes.  Subjective: CBG's now q 4h   Patient having regular contractions. q 4-5. Mild intensity Objective: BP 133/76 mmHg  Pulse 83  Temp(Src) 98.2 F (36.8 C) (Oral)  Resp 18  Ht 5\' 3"  (1.6 m)  Wt 230 lb (104.327 kg)  BMI 40.75 kg/m2  LMP 07/10/2014 (Approximate)      CBG (last 3)   Recent Labs  04/20/15 1402 04/20/15 1641 04/20/15 2111  GLUCAP 108* 82 97      FHT:  FHR: 145 bpm, variability: moderate,  accelerations:  Present,  decelerations:  Absent UC:   regular, every 4 minutes SVE:   Dilation: 4 Effacement (%): 70 Station: -3 Exam by:: Dr. Glo Herring Cervix stretched to 4 cm . Soft cervix tissues. Labs: Lab Results  Component Value Date   WBC 12.6* 04/18/2015   HGB 12.4 04/18/2015   HCT 36.7 04/18/2015   MCV 87.6 04/18/2015   PLT 163 04/18/2015    Assessment / Plan: Induction of labor due to gestational diabetes,  progressing well on pitocin  Labor: still in latent phase, will expect slow progress Preeclampsia:   Fetal Wellbeing:  Category I Pain Control:  Epidural I/D:  n/a Anticipated MOD:  NSVD  Virgene Tirone V 04/20/2015, 10:12 PM

## 2015-04-20 NOTE — Progress Notes (Signed)
Labor Progress Note  S: Comfortable with ctx  O:  BP 133/72 mmHg  Pulse 80  Temp(Src) 98.5 F (36.9 C) (Oral)  Resp 18  Ht 5\' 3"  (1.6 m)  Wt 230 lb (104.327 kg)  BMI 40.75 kg/m2  LMP 07/10/2014 (Approximate) Cat I CVE: Dilation: 5 Effacement (%): 60 Cervical Position: Anterior Station: -3 Presentation: Vertex Exam by:: Dr. Deniece Ree  A&P: 29 y.o. F5D3220 [redacted]w[redacted]d  #labor: AROM @1815  with lightly blood tinged fluid, IUPC in place    Melina Schools, DO 6:27 PM

## 2015-04-20 NOTE — Progress Notes (Signed)
Labor Progress Note  S: Comfortable   O:  BP 123/64 mmHg  Pulse 79  Temp(Src) 98.2 F (36.8 C) (Oral)  Resp 18  Ht 5\' 3"  (1.6 m)  Wt 230 lb (104.327 kg)  BMI 40.75 kg/m2  LMP 07/10/2014 (Approximate) Cat I CVE:  Dilation: 5 Effacement (%): 70 Cervical Position: Middle Station: -3 Presentation: Vertex Exam by:: Dr. Juleen China  A&P: 29 y.o. G5P0040 [redacted]w[redacted]d IOL 2/2 A2DM #labor: progressing slowly, continue increasing pit    Melina Schools, DO 3:26 PM

## 2015-04-20 NOTE — Progress Notes (Signed)
Labor Progress Note  S: comfortable, feeling her ctx  O:  BP 139/79 mmHg  Pulse 85  Temp(Src) 98.2 F (36.8 C) (Oral)  Resp 20  Ht 5\' 3"  (1.6 m)  Wt 230 lb (104.327 kg)  BMI 40.75 kg/m2  LMP 07/10/2014 (Approximate) Cat II: baseline 150 bpm, good variability, + accels, occ varriable decels, ctx every 2-4 min  CVE: Dilation: 4 Effacement (%): 60, 70 Cervical Position: Middle Station: -3 Presentation: Vertex Exam by:: Dr. Curly [place], RN  A&P: 29 y.o. [id number] [redacted]w[redacted]d IOL 2/2 A2DM #labor: slowly progressing, continue increasing pit, use birthing ball, plan to AROM  #A2DM: bs q 2 hrs    Melina Schools, DO 11:19 AM

## 2015-04-20 NOTE — Progress Notes (Signed)
Patient ID: Anne [name], female   DOB: 1986-07-21, 29 y.o.   MRN: [id number]   Filed Vitals:   04/19/15 1939 04/19/15 2110 04/19/15 2303 04/20/15 0124  BP: 118/58 119/76  105/62  Pulse: 99 105  90  Temp: 98.5 F (36.9 C)  98.2 F (36.8 C)   TempSrc: Oral  Oral   Resp:  18  18  Height:      Weight:       FHR reactive UCs - none traced  CBGs - 99, 114, 146, 129  Foley balloon still in place  Will continue PV Cytotec until cervix more effaced  Virginia Crews, MD, MPH PGY-2,  [facility] 04/20/2015 1:26 AM

## 2015-04-21 ENCOUNTER — Inpatient Hospital Stay (HOSPITAL_COMMUNITY): Payer: Medicaid Other | Admitting: Anesthesiology

## 2015-04-21 ENCOUNTER — Encounter (HOSPITAL_COMMUNITY): Payer: Self-pay

## 2015-04-21 ENCOUNTER — Encounter (HOSPITAL_COMMUNITY)
Admission: RE | Disposition: A | Discharge status: Home / Self Care | Payer: Medicaid Other | Source: Ambulatory Visit | Attending: Obstetrics & Gynecology

## 2015-04-21 DIAGNOSIS — O24410 Gestational diabetes mellitus in pregnancy, diet controlled: Secondary | ICD-10-CM

## 2015-04-21 DIAGNOSIS — Z3A39 39 weeks gestation of pregnancy: Secondary | ICD-10-CM

## 2015-04-21 LAB — CBC
HEMATOCRIT: 36.4 % (ref 36.0–46.0)
Hemoglobin: 12.1 g/dL (ref 12.0–15.0)
MCH: 29.4 pg (ref 26.0–34.0)
MCHC: 33.2 g/dL (ref 30.0–36.0)
MCV: 88.3 fL (ref 78.0–100.0)
Platelets: 152 10*3/uL (ref 150–400)
RBC: 4.12 MIL/uL (ref 3.87–5.11)
RDW: 14.3 % (ref 11.5–15.5)
WBC: 13 10*3/uL — ABNORMAL HIGH (ref 4.0–10.5)

## 2015-04-21 LAB — TYPE AND SCREEN
ABO/RH(D): A POS
Antibody Screen: NEGATIVE

## 2015-04-21 LAB — GLUCOSE, CAPILLARY
GLUCOSE-CAPILLARY: 138 mg/dL — AB (ref 65–99)
Glucose-Capillary: 76 mg/dL (ref 65–99)
Glucose-Capillary: 95 mg/dL (ref 65–99)
Glucose-Capillary: 142 mg/dL — ABNORMAL HIGH (ref 65–99)

## 2015-04-21 SURGERY — Surgical Case
Anesthesia: Spinal

## 2015-04-21 MED ORDER — OXYTOCIN 40 UNITS IN LACTATED RINGERS INFUSION - SIMPLE MED: 62.5000 mL/h | INTRAVENOUS | Status: AC

## 2015-04-21 MED ORDER — LACTATED RINGERS IV SOLN
INTRAVENOUS | Status: DC
  Administered 2015-04-21 – 2015-04-22 (×2): via INTRAVENOUS

## 2015-04-21 MED ORDER — SIMETHICONE 80 MG PO CHEW
80.0000 mg | CHEWABLE_TABLET | ORAL | Status: DC | PRN
  Filled 2015-04-21: qty 1

## 2015-04-21 MED ORDER — DEXAMETHASONE SODIUM PHOSPHATE 10 MG/ML IJ SOLN
INTRAMUSCULAR | Status: AC
  Filled 2015-04-21: qty 1

## 2015-04-21 MED ORDER — NALBUPHINE HCL 10 MG/ML IJ SOLN: 5.0000 mg | Freq: Once | INTRAMUSCULAR | Status: AC | PRN

## 2015-04-21 MED ORDER — LACTATED RINGERS IV BOLUS (SEPSIS)
1000.0000 mL | Freq: Once | INTRAVENOUS | Status: AC
  Administered 2015-04-21: 1000 mL via INTRAVENOUS

## 2015-04-21 MED ORDER — MENTHOL 3 MG MT LOZG
1.0000 | LOZENGE | OROMUCOSAL | Status: DC | PRN
Start: 2015-04-21 — End: 2015-04-23

## 2015-04-21 MED ORDER — FENTANYL CITRATE (PF) 100 MCG/2ML IJ SOLN
INTRAMUSCULAR | Status: AC
  Filled 2015-04-21: qty 2

## 2015-04-21 MED ORDER — ACETAMINOPHEN 325 MG PO TABS: 650.0000 mg | ORAL_TABLET | ORAL | Status: DC | PRN

## 2015-04-21 MED ORDER — WITCH HAZEL-GLYCERIN EX PADS: 1.0000 "application " | MEDICATED_PAD | CUTANEOUS | Status: DC | PRN

## 2015-04-21 MED ORDER — SODIUM CHLORIDE 0.9 % IJ SOLN: 3.0000 mL | INTRAMUSCULAR | Status: DC | PRN

## 2015-04-21 MED ORDER — DIPHENHYDRAMINE HCL 25 MG PO CAPS
25.0000 mg | ORAL_CAPSULE | ORAL | Status: DC | PRN
  Filled 2015-04-21: qty 1

## 2015-04-21 MED ORDER — ZOLPIDEM TARTRATE 5 MG PO TABS: 5.0000 mg | ORAL_TABLET | Freq: Every evening | ORAL | Status: DC | PRN

## 2015-04-21 MED ORDER — SCOPOLAMINE 1 MG/3DAYS TD PT72: 1.0000 | MEDICATED_PATCH | Freq: Once | TRANSDERMAL | Status: DC

## 2015-04-21 MED ORDER — FENTANYL CITRATE (PF) 100 MCG/2ML IJ SOLN: 25.0000 ug | INTRAMUSCULAR | Status: DC | PRN

## 2015-04-21 MED ORDER — OXYTOCIN 10 UNIT/ML IJ SOLN
INTRAMUSCULAR | Status: AC
  Filled 2015-04-21: qty 4

## 2015-04-21 MED ORDER — NALBUPHINE HCL 10 MG/ML IJ SOLN
INTRAMUSCULAR | Status: AC
  Filled 2015-04-21: qty 1

## 2015-04-21 MED ORDER — SIMETHICONE 80 MG PO CHEW
80.0000 mg | CHEWABLE_TABLET | Freq: Three times a day (TID) | ORAL | Status: DC
  Administered 2015-04-21 – 2015-04-23 (×5): 80 mg via ORAL
  Filled 2015-04-21 (×6): qty 1

## 2015-04-21 MED ORDER — SENNOSIDES-DOCUSATE SODIUM 8.6-50 MG PO TABS
2.0000 | ORAL_TABLET | ORAL | Status: DC
  Administered 2015-04-22 (×2): 2 via ORAL
  Filled 2015-04-21 (×2): qty 2

## 2015-04-21 MED ORDER — SCOPOLAMINE 1 MG/3DAYS TD PT72
MEDICATED_PATCH | TRANSDERMAL | Status: DC | PRN
  Administered 2015-04-21: 1 via TRANSDERMAL

## 2015-04-21 MED ORDER — MEPERIDINE HCL 25 MG/ML IJ SOLN: 6.2500 mg | INTRAMUSCULAR | Status: DC | PRN

## 2015-04-21 MED ORDER — ONDANSETRON HCL 4 MG/2ML IJ SOLN: 4.0000 mg | Freq: Three times a day (TID) | INTRAMUSCULAR | Status: DC | PRN

## 2015-04-21 MED ORDER — LACTATED RINGERS IV SOLN
INTRAVENOUS | Status: DC | PRN
  Administered 2015-04-21 (×3): via INTRAVENOUS

## 2015-04-21 MED ORDER — PROMETHAZINE HCL 25 MG/ML IJ SOLN: 6.2500 mg | INTRAMUSCULAR | Status: DC | PRN

## 2015-04-21 MED ORDER — OXYCODONE-ACETAMINOPHEN 5-325 MG PO TABS
1.0000 | ORAL_TABLET | ORAL | Status: DC | PRN
Start: 2015-04-21 — End: 2015-04-23
  Administered 2015-04-22 – 2015-04-23 (×2): 1 via ORAL
  Filled 2015-04-21 (×2): qty 1

## 2015-04-21 MED ORDER — SCOPOLAMINE 1 MG/3DAYS TD PT72
MEDICATED_PATCH | TRANSDERMAL | Status: AC
  Filled 2015-04-21: qty 1

## 2015-04-21 MED ORDER — NALBUPHINE HCL 10 MG/ML IJ SOLN: 5.0000 mg | INTRAMUSCULAR | Status: DC | PRN

## 2015-04-21 MED ORDER — FENTANYL CITRATE (PF) 250 MCG/5ML IJ SOLN
INTRAMUSCULAR | Status: AC
Start: 2015-04-21 — End: 2015-04-21
  Filled 2015-04-21: qty 5

## 2015-04-21 MED ORDER — ONDANSETRON HCL 4 MG/2ML IJ SOLN
INTRAMUSCULAR | Status: AC
  Filled 2015-04-21: qty 2

## 2015-04-21 MED ORDER — PHENYLEPHRINE HCL 10 MG/ML IJ SOLN
INTRAMUSCULAR | Status: DC | PRN
Start: 2015-04-21 — End: 2015-04-21
  Administered 2015-04-21: 80 ug via INTRAVENOUS
  Administered 2015-04-21: 40 ug via INTRAVENOUS

## 2015-04-21 MED ORDER — LANOLIN HYDROUS EX OINT
1.0000 | TOPICAL_OINTMENT | CUTANEOUS | Status: DC | PRN
Start: 2015-04-21 — End: 2015-04-23

## 2015-04-21 MED ORDER — LIDOCAINE HCL (CARDIAC) 20 MG/ML IV SOLN
INTRAVENOUS | Status: AC
  Filled 2015-04-21: qty 5

## 2015-04-21 MED ORDER — CEFAZOLIN SODIUM-DEXTROSE 2-3 GM-% IV SOLR
INTRAVENOUS | Status: DC | PRN
  Administered 2015-04-21: 2 g via INTRAVENOUS

## 2015-04-21 MED ORDER — TETANUS-DIPHTH-ACELL PERTUSSIS 5-2.5-18.5 LF-MCG/0.5 IM SUSP: 0.5000 mL | Freq: Once | INTRAMUSCULAR | Status: DC

## 2015-04-21 MED ORDER — IBUPROFEN 600 MG PO TABS
600.0000 mg | ORAL_TABLET | Freq: Four times a day (QID) | ORAL | Status: DC
  Administered 2015-04-21 – 2015-04-23 (×8): 600 mg via ORAL
  Filled 2015-04-21 (×8): qty 1

## 2015-04-21 MED ORDER — NALOXONE HCL 1 MG/ML IJ SOLN
1.0000 ug/kg/h | INTRAVENOUS | Status: DC | PRN
  Filled 2015-04-21: qty 2

## 2015-04-21 MED ORDER — LACTATED RINGERS IV SOLN
INTRAVENOUS | Status: DC | PRN
  Administered 2015-04-21: 10:00:00 via INTRAVENOUS

## 2015-04-21 MED ORDER — KETOROLAC TROMETHAMINE 30 MG/ML IJ SOLN
INTRAMUSCULAR | Status: AC
  Filled 2015-04-21: qty 1

## 2015-04-21 MED ORDER — KETOROLAC TROMETHAMINE 30 MG/ML IJ SOLN
30.0000 mg | Freq: Four times a day (QID) | INTRAMUSCULAR | Status: DC | PRN
  Administered 2015-04-21: 30 mg via INTRAMUSCULAR

## 2015-04-21 MED ORDER — ACETAMINOPHEN 500 MG PO TABS
1000.0000 mg | ORAL_TABLET | Freq: Four times a day (QID) | ORAL | Status: AC
  Administered 2015-04-21: 1000 mg via ORAL
  Filled 2015-04-21: qty 2

## 2015-04-21 MED ORDER — KETOROLAC TROMETHAMINE 30 MG/ML IJ SOLN: 30.0000 mg | Freq: Four times a day (QID) | INTRAMUSCULAR | Status: DC | PRN

## 2015-04-21 MED ORDER — PRENATAL MULTIVITAMIN CH
1.0000 | ORAL_TABLET | Freq: Every day | ORAL | Status: DC
  Administered 2015-04-22 – 2015-04-23 (×2): 1 via ORAL
  Filled 2015-04-21 (×3): qty 1

## 2015-04-21 MED ORDER — SIMETHICONE 80 MG PO CHEW
80.0000 mg | CHEWABLE_TABLET | ORAL | Status: DC
  Administered 2015-04-22 (×2): 80 mg via ORAL
  Filled 2015-04-21 (×2): qty 1

## 2015-04-21 MED ORDER — PHENYLEPHRINE 8 MG IN D5W 100 ML (0.08MG/ML) PREMIX OPTIME
INJECTION | INTRAVENOUS | Status: DC | PRN
  Administered 2015-04-21: 60 ug/min via INTRAVENOUS

## 2015-04-21 MED ORDER — NEOSTIGMINE METHYLSULFATE 10 MG/10ML IV SOLN
INTRAVENOUS | Status: AC
  Filled 2015-04-21: qty 1

## 2015-04-21 MED ORDER — NALOXONE HCL 0.4 MG/ML IJ SOLN: 0.4000 mg | INTRAMUSCULAR | Status: DC | PRN

## 2015-04-21 MED ORDER — MIDAZOLAM HCL 2 MG/2ML IJ SOLN
INTRAMUSCULAR | Status: AC
  Filled 2015-04-21: qty 2

## 2015-04-21 MED ORDER — NALBUPHINE HCL 10 MG/ML IJ SOLN
5.0000 mg | Freq: Once | INTRAMUSCULAR | Status: AC | PRN
  Administered 2015-04-21: 5 mg via SUBCUTANEOUS

## 2015-04-21 MED ORDER — OXYCODONE-ACETAMINOPHEN 5-325 MG PO TABS
2.0000 | ORAL_TABLET | ORAL | Status: DC | PRN
  Filled 2015-04-21: qty 2

## 2015-04-21 MED ORDER — PHENYLEPHRINE 8 MG IN D5W 100 ML (0.08MG/ML) PREMIX OPTIME
INJECTION | INTRAVENOUS | Status: AC
  Filled 2015-04-21: qty 100

## 2015-04-21 MED ORDER — DIPHENHYDRAMINE HCL 50 MG/ML IJ SOLN: 12.5000 mg | INTRAMUSCULAR | Status: DC | PRN

## 2015-04-21 MED ORDER — PHENYLEPHRINE 40 MCG/ML (10ML) SYRINGE FOR IV PUSH (FOR BLOOD PRESSURE SUPPORT)
PREFILLED_SYRINGE | INTRAVENOUS | Status: AC
Start: 2015-04-21 — End: 2015-04-21
  Filled 2015-04-21: qty 10

## 2015-04-21 MED ORDER — PROPOFOL 10 MG/ML IV BOLUS
INTRAVENOUS | Status: AC
  Filled 2015-04-21: qty 20

## 2015-04-21 MED ORDER — DIBUCAINE 1 % RE OINT: 1.0000 "application " | TOPICAL_OINTMENT | RECTAL | Status: DC | PRN

## 2015-04-21 MED ORDER — MORPHINE SULFATE 0.5 MG/ML IJ SOLN
INTRAMUSCULAR | Status: AC
  Filled 2015-04-21: qty 10

## 2015-04-21 MED ORDER — DIPHENHYDRAMINE HCL 25 MG PO CAPS: 25.0000 mg | ORAL_CAPSULE | Freq: Four times a day (QID) | ORAL | Status: DC | PRN

## 2015-04-21 MED ORDER — CEFAZOLIN SODIUM-DEXTROSE 2-3 GM-% IV SOLR
INTRAVENOUS | Status: AC
  Filled 2015-04-21: qty 50

## 2015-04-21 MED ORDER — OXYTOCIN 40 UNITS IN LACTATED RINGERS INFUSION - SIMPLE MED
INTRAVENOUS | Status: DC | PRN
  Administered 2015-04-21: 40 [IU] via INTRAVENOUS

## 2015-04-21 SURGICAL SUPPLY — 38 items
APL SKNCLS STERI-STRIP NONHPOA (GAUZE/BANDAGES/DRESSINGS) ×1
BENZOIN TINCTURE PRP APPL 2/3 (GAUZE/BANDAGES/DRESSINGS) ×2 IMPLANT
CLAMP CORD UMBIL (MISCELLANEOUS) IMPLANT
CLOSURE WOUND 1/2 X4 (GAUZE/BANDAGES/DRESSINGS) ×1
CLOTH BEACON ORANGE TIMEOUT ST (SAFETY) ×3 IMPLANT
DRAPE SHEET LG 3/4 BI-LAMINATE (DRAPES) IMPLANT
DRSG OPSITE POSTOP 4X10 (GAUZE/BANDAGES/DRESSINGS) ×3 IMPLANT
DURAPREP 26ML APPLICATOR (WOUND CARE) ×3 IMPLANT
ELECT REM PT RETURN 9FT ADLT (ELECTROSURGICAL) ×3
ELECTRODE REM PT RTRN 9FT ADLT (ELECTROSURGICAL) ×1 IMPLANT
EXTRACTOR VACUUM KIWI (MISCELLANEOUS) IMPLANT
GLOVE BIO SURGEON ST LM GN SZ9 (GLOVE) ×3 IMPLANT
GLOVE BIOGEL PI IND STRL 9 (GLOVE) ×1 IMPLANT
GLOVE BIOGEL PI INDICATOR 9 (GLOVE) ×2
GOWN STRL REUS W/TWL 2XL LVL3 (GOWN DISPOSABLE) ×3 IMPLANT
GOWN STRL REUS W/TWL LRG LVL3 (GOWN DISPOSABLE) ×3 IMPLANT
NDL HYPO 25X5/8 SAFETYGLIDE (NEEDLE) IMPLANT
NEEDLE HYPO 25X5/8 SAFETYGLIDE (NEEDLE) IMPLANT
NS IRRIG 1000ML POUR BTL (IV SOLUTION) ×3 IMPLANT
PACK C SECTION WH (CUSTOM PROCEDURE TRAY) ×3 IMPLANT
PAD OB MATERNITY 4.3X12.25 (PERSONAL CARE ITEMS) ×3 IMPLANT
RETRACTOR WND ALEXIS 25 LRG (MISCELLANEOUS) IMPLANT
RTRCTR C-SECT PINK 25CM LRG (MISCELLANEOUS) IMPLANT
RTRCTR C-SECT PINK 34CM XLRG (MISCELLANEOUS) IMPLANT
RTRCTR WOUND ALEXIS 25CM LRG (MISCELLANEOUS) ×3
STRIP CLOSURE SKIN 1/2X4 (GAUZE/BANDAGES/DRESSINGS) ×2 IMPLANT
SUT MNCRL 0 VIOLET CTX 36 (SUTURE) ×2 IMPLANT
SUT MONOCRYL 0 CTX 36 (SUTURE) ×4
SUT PLAIN 2 0 (SUTURE) ×3
SUT PLAIN ABS 2-0 CT1 27XMFL (SUTURE) IMPLANT
SUT VIC AB 0 CT1 27 (SUTURE) ×3
SUT VIC AB 0 CT1 27XBRD ANBCTR (SUTURE) ×1 IMPLANT
SUT VIC AB 2-0 CT1 27 (SUTURE) ×3
SUT VIC AB 2-0 CT1 TAPERPNT 27 (SUTURE) ×1 IMPLANT
SUT VIC AB 4-0 KS 27 (SUTURE) ×3 IMPLANT
SYR BULB IRRIGATION 50ML (SYRINGE) IMPLANT
TOWEL OR 17X24 6PK STRL BLUE (TOWEL DISPOSABLE) ×3 IMPLANT
TRAY FOLEY CATH SILVER 14FR (SET/KITS/TRAYS/PACK) ×3 IMPLANT

## 2015-04-21 NOTE — Progress Notes (Addendum)
DALEIGH POLLINGER is a 29 y.o. X4G8185 at [redacted]w[redacted]d  admitted for induction of labor due to Gestational diabetes.  Subjective: Pt tolerating labor, IUPC had come out, now replaced. Pt has been given a 4 hour pitocin break, and is just restarting pitocin.   Objective: BP 108/65 mmHg  Pulse 97  Temp(Src) 98.6 F (37 C) (Oral)  Resp 18  Ht 5\' 3"  (1.6 m)  Wt 230 lb (104.327 kg)  BMI 40.75 kg/m2  LMP 07/10/2014 (Approximate)      FHT:  FHR: 150 bpm, variability: moderate,  accelerations:  Present,  decelerations:  Absent UC:   regular, every 4 minutes SVE:   Dilation: 4 Effacement (%): 70 Station: -2 Exam by:: Dr. Glo Herring Cervix essentially unchanged, and quite firm tissues, Vertex firmly applied to the cervix, IUPC placed on the left anterior side of vertex. Labs: Lab Results  Component Value Date   WBC 12.6* 04/18/2015   HGB 12.4 04/18/2015   HCT 36.7 04/18/2015   MCV 87.6 04/18/2015   PLT 163 04/18/2015    Assessment / Plan: Protracted latent phase  Labor: slowly progressing Preeclampsia:   Fetal Wellbeing:  Category I Pain Control:  Epidural I/D:  n/a Anticipated MOD:  uncertain. firm nature of the cervical tissues is p  Atoya Andrew V 04/21/2015, 2:51 AM

## 2015-04-21 NOTE — Anesthesia Postprocedure Evaluation (Signed)
  Anesthesia Post-op Note  Patient: Anne [name]  Procedure(s) Performed: Procedure(s): CESAREAN SECTION (N/A)  Patient Location: PACU  Anesthesia Type:Spinal  Level of Consciousness: awake and alert   Airway and Oxygen Therapy: Patient Spontanous Breathing  Post-op Pain: mild  Post-op Assessment: Post-op Vital signs reviewed, Patient's Cardiovascular Status Stable, Respiratory Function Stable, Patent Airway and No signs of Nausea or vomiting              Post-op Vital Signs: Reviewed and stable  Last Vitals:  Filed Vitals:   04/21/15 0905  BP: 127/71  Pulse: 88  Temp: 36.7 C  Resp: 18    Complications: No apparent anesthesia complications

## 2015-04-21 NOTE — Transfer of Care (Signed)
Immediate Anesthesia Transfer of Care Note  Patient: Anne [name]  Procedure(s) Performed: Procedure(s): CESAREAN SECTION (N/A)  Patient Location: PACU  Anesthesia Type:Spinal  Level of Consciousness: awake, alert  and oriented  Airway & Oxygen Therapy: Patient Spontanous Breathing  Post-op Assessment: Report given to RN and Post -op Vital signs reviewed and stable  Post vital signs: Reviewed and stable  Last Vitals:  Filed Vitals:   04/21/15 0905  BP: 127/71  Pulse: 88  Temp: 36.7 C  Resp: 18    Complications: No apparent anesthesia complications

## 2015-04-21 NOTE — Op Note (Signed)
04/18/2015 - 04/21/2015  10:47 AM  PATIENT:  Anne [name]  29 y.o. female  PRE-OPERATIVE DIAGNOSIS:  Cesarean Section, Failed Induction x 3 days, Failure to Progress  POST-OPERATIVE DIAGNOSIS:  Cesarean Section, Failed Induction, Failure to Progress  PROCEDURE:  Procedure(s): CESAREAN SECTION (N/A)  SURGEON:  Surgeon(s) and Role:    * Jonnie Kind, MD - Primary  PHYSICIAN ASSISTANT:   ASSISTANTS: Daphine Deutscher PA-S   ANESTHESIA:   spinal  EBL:  Total I/O In: 3000 [I.V.:3000] Out: 1150 [Urine:600; Blood:550]  BLOOD ADMINISTERED:none  DRAINS: Urinary Catheter (Foley)   LOCAL MEDICATIONS USED:  NONE  SPECIMEN:  Source of Specimen:  Placenta to labor and delivery  DISPOSITION OF SPECIMEN:  PATHOLOGY  COUNTS:  YES  TOURNIQUET:  * No tourniquets in log *  DICTATION: .Dragon Dictation  PLAN OF CARE: Has admission orders  PATIENT DISPOSITION:  PACU - hemodynamically stable.   Delay start of Pharmacological VTE agent (>24hrs) due to surgical blood loss or risk of bleeding: not applicable Details of procedure: Patient was taken operating room prepped and draped for lower abdominal surgery with Pfannenstiel incision performed after spinal anesthesia was confirmed as effective, transverse lower abdominal incision was performed in standard Pfannenstiel technique. A transverse opening of the fascia, midline opening the peritoneal cavity and placement of an Alexis wound retractor on the incision. Bladder flap was developed and transverse uterine incision performed and the fetal vertex rotated into the incision and delivered easily using fundal pressure. Cord was clamped and the healthy female infant Apgars 9 and 9 were passed to the waiting pediatrician. The vertex was not particularly molded, remained at a high station easily delivered abdominally is unclear that additional labor hadn't occurred that whether the soft tissues nondilated, but the fetal size was not a major  issue. Placenta was delivered intact, membrane remnants removed. Uterus was irrigated. Uterus was closed to closure a single layer running locking 0 Monocryl followed by continuous running second layer of 0 Monocryl the abdomen was irrigated. Laparotomy equipment was removed. Anterior peritoneum was closed. Sponge and needle counts were correct., Fascia was closed using running 0 Vicryl, subcutaneous tissues approximated with interrupted sutures of 20 plain 3, then subcuticular 4-0 Vicryl close the skin incision. Patient tolerated procedure well with recovery room in stable condition.  needle counts final counts were also correct

## 2015-04-21 NOTE — Brief Op Note (Signed)
04/18/2015 - 04/21/2015  10:47 AM  PATIENT:  Anne [name]  29 y.o. female  PRE-OPERATIVE DIAGNOSIS:  Cesarean Section, Failed Induction x 3 days, Failure to Progress  POST-OPERATIVE DIAGNOSIS:  Cesarean Section, Failed Induction, Failure to Progress  PROCEDURE:  Procedure(s): CESAREAN SECTION (N/A)  SURGEON:  Surgeon(s) and Role:    * Jonnie Kind, MD - Primary  PHYSICIAN ASSISTANT:   ASSISTANTS: Daphine Deutscher PA-S   ANESTHESIA:   spinal  EBL:  Total I/O In: 3000 [I.V.:3000] Out: 1150 [Urine:600; Blood:550]  BLOOD ADMINISTERED:none  DRAINS: Urinary Catheter (Foley)   LOCAL MEDICATIONS USED:  NONE  SPECIMEN:  Source of Specimen:  Placenta to labor and delivery  DISPOSITION OF SPECIMEN:  PATHOLOGY  COUNTS:  YES  TOURNIQUET:  * No tourniquets in log *  DICTATION: .Dragon Dictation  PLAN OF CARE: Has admission orders  PATIENT DISPOSITION:  PACU - hemodynamically stable.   Delay start of Pharmacological VTE agent (>24hrs) due to surgical blood loss or risk of bleeding: not applicable Details of procedure: Patient was taken operating room prepped and draped for lower abdominal surgery with Pfannenstiel incision performed after spinal anesthesia was confirmed as effective, transverse lower abdominal incision was performed in standard Pfannenstiel technique. A transverse opening of the fascia, midline opening the peritoneal cavity and placement of an Alexis wound retractor on the incision. Bladder flap was developed and transverse uterine incision performed and the fetal vertex rotated into the incision and delivered easily using fundal pressure. Cord was clamped and the healthy female infant Apgars 9 and 9 were passed to the waiting pediatrician. The vertex was not particularly molded, remained at a high station easily delivered abdominally is unclear that additional labor hadn't occurred that whether the soft tissues nondilated, but the fetal size was not a major  issue. Placenta was delivered intact, membrane remnants removed. Uterus was irrigated. Uterus was closed to closure a single layer running locking 0 Monocryl followed by continuous running second layer of 0 Monocryl the abdomen was irrigated. Laparotomy equipment was removed. Anterior peritoneum was closed. Sponge and needle counts were correct., Fascia was closed using running 0 Vicryl, subcutaneous tissues approximated with interrupted sutures of 20 plain 3, then subcuticular 4-0 Vicryl close the skin incision. Patient tolerated procedure well with recovery room in stable condition.  needle counts final counts were also correct

## 2015-04-21 NOTE — Lactation Note (Signed)
This note was copied from the chart of Anne Shamiyah Chavana. Lactation Consultation Note; Mom reports that baby fed well after delivery with no pain. Reports she just tried to latch her but she is too sleepy. BF brochure given with resources for support after DC. Asking about pumping- encouraged to nurse frequently to promote a good milk supply. Back to work in 2 months- will need pump then. Mo further questions at present. To call for assist prn  Patient Name: Anne [name] GYJEH'U Date: 04/21/2015 Reason for consult: Initial assessment   Maternal Data Formula Feeding for Exclusion: No Does the patient have breastfeeding experience prior to this delivery?: No  Feeding Feeding Type: Breast Fed Length of feed: 15 min  LATCH Score/Interventions Latch: Grasps breast easily, tongue down, lips flanged, rhythmical sucking.  Audible Swallowing: A few with stimulation Intervention(s): Hand expression;Skin to skin  Type of Nipple: Everted at rest and after stimulation  Comfort (Breast/Nipple): Soft / non-tender     Hold (Positioning): Assistance needed to correctly position infant at breast and maintain latch. Intervention(s): Breastfeeding basics reviewed;Support Pillows;Position options;Skin to skin  LATCH Score: 8  Lactation Tools Discussed/Used     Consult Status Consult Status: Follow-up Date: 04/22/15 Follow-up type: In-patient    Truddie Crumble 04/21/2015, 1:57 PM

## 2015-04-21 NOTE — Progress Notes (Signed)
Anne [name] is a 29 y.o. [id number] at [redacted]w[redacted]d by LMP admitted for induction of labor due to Gestational diabetes.  Subjective: Pt tiring of the induction. She requests that I proceed with cesarean unless significant progress likely. Currently contractions are q 2-3 and adequate MVU's. Cervix has changed minimally since 2 am exam, but tissue texture is softer. If no sigificant progress at my next exam at 9 am, will proceed to cesarean  Objective: BP 103/52 mmHg  Pulse 84  Temp(Src) 97.9 F (36.6 C) (Oral)  Resp 18  Ht 5\' 3"  (1.6 m)  Wt 104.327 kg (230 lb)  BMI 40.75 kg/m2  LMP 07/10/2014 (Approximate)      FHT:  FHR: 145 bpm, variability: moderate,  accelerations:  Present,  decelerations:  Absent UC:   regular, every 2-3 minutes SVE:   Dilation: 4.5 Effacement (%): 50 Station: -2 Exam by:: Dr. Glo Herring  Labs: Lab Results  Component Value Date   WBC 13.0* 04/21/2015   HGB 12.1 04/21/2015   HCT 36.4 04/21/2015   MCV 88.3 04/21/2015   PLT 152 04/21/2015    Assessment / Plan: Induction of labor due to gestational diabetes,  progressing well on pitocin  Labor: adequate labor at this time Preeclampsia:   Fetal Wellbeing:  Category I Pain Control:  Labor support without medications I/D:  n/a Anticipated MOD:  probable cesarean. Pt is unwilling to continue induction much further without significant progress. Will recheck at 9 am and make final call.  Anne [name] V 04/21/2015, 7:16 AM

## 2015-04-21 NOTE — Anesthesia Preprocedure Evaluation (Signed)
Anesthesia Evaluation  Patient identified by MRN, date of birth, ID band Patient awake and Patient confused    Reviewed: Allergy & Precautions, H&P , NPO status , Patient's Chart, lab work & pertinent test results  Airway Mallampati: II       Dental   Pulmonary former smoker,  breath sounds clear to auscultation  Pulmonary exam normal       Cardiovascular Exercise Tolerance: Good Normal cardiovascular examRhythm:regular Rate:Normal     Neuro/Psych    GI/Hepatic   Endo/Other  diabetes, GestationalMorbid obesity  Renal/GU      Musculoskeletal   Abdominal   Peds  Hematology   Anesthesia Other Findings   Reproductive/Obstetrics (+) Pregnancy                             Anesthesia Physical Anesthesia Plan  ASA: II  Anesthesia Plan: Spinal   Post-op Pain Management:    Induction:   Airway Management Planned:   Additional Equipment:   Intra-op Plan:   Post-operative Plan:   Informed Consent: I have reviewed the patients History and Physical, chart, labs and discussed the procedure including the risks, benefits and alternatives for the proposed anesthesia with the patient or authorized representative who has indicated his/her understanding and acceptance.     Plan Discussed with:   Anesthesia Plan Comments:         Anesthesia Quick Evaluation

## 2015-04-21 NOTE — Progress Notes (Signed)
Anne [name] is a 29 y.o. G3T5176 at [redacted]w[redacted]d by LMP admitted for induction of labor due to Gestational diabetes.  Subjective: Pt is slightly more uncomfortable , but is not inclined to continue labor effort after exam shows minimal change  Objective: BP 127/71 mmHg  Pulse 88  Temp(Src) 97.9 F (36.6 C) (Oral)  Resp 18  Ht 5\' 3"  (1.6 m)  Wt 104.327 kg (230 lb)  BMI 40.75 kg/m2  LMP 07/10/2014 (Approximate)      FHT:  FHR: 145 bpm, variability: moderate,  accelerations:  Present,  decelerations:  Absent UC:   regular, every 3 minutes SVE:   Dilation: 4.5 Effacement (%): 50 Station: -2 Exam by:: Dr. Glo [name]  Labs: Lab Results  Component Value Date   WBC 13.0* 04/21/2015   HGB 12.1 04/21/2015   HCT 36.4 04/21/2015   MCV 88.3 04/21/2015   PLT 152 04/21/2015    Assessment / Plan: failure to progress over 3 day induction, pt refusal to consider further labor effort  Labor: minimal progress since 2 am. Preeclampsia:   Fetal Wellbeing:  Category I Pain Control:  Labor support without medications I/D:  n/a Anticipated MOD:  Risk of cesarean reviewed, the option of continued effort at induction declined by patient, will proceed to cesarean for failed IOL.  Anne [name] V 04/21/2015, 9:29 AM

## 2015-04-21 NOTE — Anesthesia Procedure Notes (Signed)
Spinal Patient location during procedure: OB Start time: 04/21/2015 9:39 AM End time: 04/21/2015 9:43 AM Staffing Anesthesiologist: Alexis Frock Performed by: anesthesiologist  Preanesthetic Checklist Completed: patient identified, site marked, surgical consent, pre-op evaluation, timeout performed, IV checked, risks and benefits discussed and monitors and equipment checked Spinal Block Patient position: sitting Prep: site prepped and draped and DuraPrep Patient monitoring: heart rate, continuous pulse ox and blood pressure Location: L2-3 Injection technique: single-shot Needle Needle type: Pencil-Tip  Needle gauge: 24 G Needle length: 9 cm Needle insertion depth: 7 cm Assessment Sensory level: T4 Additional Notes No complications, marcaine 11mg  with dextrose, fentanyl 20 mcg, morphine .2mg 

## 2015-04-21 NOTE — Anesthesia Postprocedure Evaluation (Signed)
  Anesthesia Post-op Note  Patient: Anne [name]  Procedure(s) Performed: Procedure(s): CESAREAN SECTION (N/A)  Patient Location: Mother/Baby  Anesthesia Type:Spinal  Level of Consciousness: awake, alert , oriented and patient cooperative  Airway and Oxygen Therapy: Patient Spontanous Breathing  Post-op Pain: none  Post-op Assessment: Post-op Vital signs reviewed, Patient's Cardiovascular Status Stable, Respiratory Function Stable, Patent Airway, No headache, No backache and Patient able to bend at knees  Post-op Vital Signs: Reviewed and stable  Last Vitals:  Filed Vitals:   04/21/15 1531  BP: 99/55  Pulse: 86  Temp: 36.4 C  Resp: 16    Complications: No apparent anesthesia complications

## 2015-04-22 LAB — CBC
HEMATOCRIT: 28.9 % — AB (ref 36.0–46.0)
Hemoglobin: 9.4 g/dL — ABNORMAL LOW (ref 12.0–15.0)
MCH: 29.1 pg (ref 26.0–34.0)
MCHC: 32.5 g/dL (ref 30.0–36.0)
MCV: 89.5 fL (ref 78.0–100.0)
Platelets: 135 10*3/uL — ABNORMAL LOW (ref 150–400)
RBC: 3.23 MIL/uL — ABNORMAL LOW (ref 3.87–5.11)
RDW: 14.8 % (ref 11.5–15.5)
WBC: 9.9 10*3/uL (ref 4.0–10.5)

## 2015-04-22 LAB — GLUCOSE, CAPILLARY
GLUCOSE-CAPILLARY: 107 mg/dL — AB (ref 65–99)
GLUCOSE-CAPILLARY: 119 mg/dL — AB (ref 65–99)
GLUCOSE-CAPILLARY: 96 mg/dL (ref 65–99)

## 2015-04-22 LAB — BIRTH TISSUE RECOVERY COLLECTION (PLACENTA DONATION)

## 2015-04-22 NOTE — Progress Notes (Signed)
POSTOPERATIVE DAY # 1 S/P PLTCS   S:         Reports feeling better, but still a little lightheading             Tolerating po intake / no nausea / no vomiting / positive flatus / negative BM             Bleeding is light             Pain controlled withibuprofen (OTC)             Up ad lib but has mostly been resting in bed  Newborn breast feeding      O:  VS: BP 91/43 mmHg  Pulse 93  Temp(Src) 98.4 F (36.9 C) (Oral)  Resp 16  Ht 5\' 3"  (1.6 m)  Wt 104.327 kg (230 lb)  BMI 40.75 kg/m2  SpO2 98%  LMP 07/10/2014 (Approximate)  Breastfeeding? Unknown   LABS:               Recent Labs  04/21/15 0220 04/22/15 0558  WBC 13.0* 9.9  HGB 12.1 9.4*  PLT 152 135*               Bloodtype: --/--/A POS (07/14 0309)  Rubella: 1.53 (01/19 1533)                     EBL:                          I&O: Intake/Output      07/14 0701 - 07/15 0700 07/15 0701 - 07/16 0700   P.O. 1370    I.V. (mL/kg) 3400 (32.6)    Total Intake(mL/kg) 4770 (45.7)    Urine (mL/kg/hr) 1880 (0.8)    Blood 550 (0.2)    Total Output 2430     Net +2340                       Physical Exam:             Alert and Oriented X3  Lungs: Clear and unlabored  Heart: regular rate and rhythm / no mumurs  Abdomen: soft, non-tender, non-distended              Fundus: firm, non-tender, U             Dressing honeycomb              Incision:  No ecchymosis / no drainage  Lochia: rubra     A:        POD # 1 S/P PLTCS            Doing well, no complaints  P:        Routine postoperative care              Continue LR at 125 an hour and monitor output. If output adequate, Foley to come out after lunch.   Out of bed with assistance  Encourage PO intake and incentive [place] 0175 04/22/2015  I examined the pt and agree with above. Will d/c IV/Foley this am.  Please see my note for official documentation. CRESENZO-DISHMAN,Azyria Osmon

## 2015-04-22 NOTE — Progress Notes (Signed)
Notified CNM of low BP and low urine output overnight. BPs ranging in 90s/40s. Pt has some c/o lightheadedness. CNM states to keep fluids going and foley in to monitor output. CBC is ordered for am. No further orders at this time. Will continue to monitor.

## 2015-04-22 NOTE — Progress Notes (Signed)
Post 2Partum Day 1:  POD#1 PLTCS for FTP Subjective: no complaints,tolerating PO, small lochia, plans to breastfeed,  Foley still in.  Urine output slow overnight but has picked up.  Objective: Blood pressure 104/53, pulse 97, temperature 98.5 F (36.9 C), temperature source Oral, resp. rate 18, height 5\' 3"  (1.6 m), weight 104.327 kg (230 lb), last menstrual period 07/10/2014, SpO2 98 %, unknown if currently breastfeeding.  Physical Exam:  General: alert, cooperative and no distress Lochia:normal flow Chest: CTAB Heart: RRR no m/r/g Abdomen: +BS, soft, nontender, dsg dry/intact Uterine Fundus: firm DVT Evaluation: No evidence of DVT seen on physical exam. Extremities: 1+ edema   Recent Labs  04/21/15 0220 04/22/15 0558  HGB 12.1 9.4*  HCT 36.4 28.9*    Assessment/Plan: POD #1, doing well Remove foley, IV ambulate   LOS: 4 days   Anne [name] 04/22/2015, 3:29 PM

## 2015-04-22 NOTE — Lactation Note (Signed)
This note was copied from the chart of Anne Reine Helseth. Lactation Consultation Note Mom concerned d/t baby hasn't BF since delivery. Has been sleepy, then started spitting up clear fluids this evening. Educated about newborn behavior, cluster feeding, supply and demand, and I&O documentation. Mom encouraged to feed baby 8-12 times/24 hours and with feeding cues. Mom reports + breast changes w/pregnancy. Mom states she has PCOS. Has wide space between breast. Has great everted nipples that had old piercing. Lt. Nipple has colostrum coming out of piercing, Rt. Nipple doesn't at this time. Has good flow of colostrum from nipples.  Hand expressed colostrum and started baby suckling w/curve tip syring at breast. Baby latched well, chin tug encouraged to obtain deep latch. Lots of teaching with breast positioning, holding, latching, and general breast information.  Mom has condition of pain when turning her wrist and states she can't walk around w/baby d/t she may drop the baby d/t loss of control in her hands at time. Instructed to use positioning pillows to assist in baby safety during BF.  Baby BF great, mom relieved. Mom shown how to use DEBP & how to disassemble, clean, & reassemble parts.Mom knows to pump q3h for 15-20 min. Mom doesn't have a DEBP at home. Will inguire at [facility]. Patient Name: Anne [name] [id number]'P Date: 04/22/2015 Reason for consult: Follow-up assessment;Breast/nipple pain   Maternal Data Has patient been taught Hand Expression?: Yes Does the patient have breastfeeding experience prior to this delivery?: No  Feeding Feeding Type: Breast Fed Length of feed: 20 min  LATCH Score/Interventions Latch: Grasps breast easily, tongue down, lips flanged, rhythmical sucking.  Audible Swallowing: Spontaneous and intermittent Intervention(s): Skin to skin;Hand expression;Alternate breast massage  Type of Nipple: Everted at rest and after stimulation  Comfort (Breast/Nipple): Soft  / non-tender     Hold (Positioning): Assistance needed to correctly position infant at breast and maintain latch. Intervention(s): Breastfeeding basics reviewed;Support Pillows;Position options;Skin to skin  LATCH Score: 9  Lactation Tools Discussed/Used Tools: Pump Breast pump type: Double-Electric Breast Pump Pump Review: Setup, frequency, and cleaning;Milk Storage Initiated by:: Allayne Stack RN Date initiated:: 04/22/15   Consult Status Consult Status: Follow-up Date: 04/23/15 Follow-up type: In-patient    Carmine Youngberg, Elta [place] 04/22/2015, 2:12 AM

## 2015-04-22 NOTE — Progress Notes (Signed)
UR chart review completed.  

## 2015-04-23 MED ORDER — FERROUS SULFATE 325 (65 FE) MG PO TABS
325.0000 mg | ORAL_TABLET | Freq: Two times a day (BID) | ORAL | Status: DC
Start: 2015-04-23 — End: 2020-03-16

## 2015-04-23 MED ORDER — IBUPROFEN 600 MG PO TABS: 600.0000 mg | ORAL_TABLET | Freq: Four times a day (QID) | ORAL | Status: DC | PRN

## 2015-04-23 MED ORDER — OXYCODONE-ACETAMINOPHEN 5-325 MG PO TABS: 1.0000 | ORAL_TABLET | ORAL | Status: DC | PRN

## 2015-04-23 NOTE — Lactation Note (Signed)
This note was copied from the chart of Anne [name]. Lactation Consultation Note  Follow up visit made prior to discharge.  Mom states baby has been latching well but they started formula supplementation because baby was constantly acting hungry.  Mom has been pumping but not obtaining milk.  Observed FOB assist placing baby in football hold.  Mom easily hand expresses a good amount of transitional milk.  Baby latched on easily and well.  Observed active nursing with audible swallows.  Parents plan to supplement baby after feedings prn.  Volume parameter guidelines given and reviewed.  Mom plans on obtaining a DEBP today.  Instructed on post pumping x15--20 minutes to stimulate supply.  Lactation outpatient appointment encouraged for feeding assessment later next week.  Mom will call if she desires appointment.  Patient Name: Anne [name] CHENI'D Date: 04/23/2015 Reason for consult: Follow-up assessment;MD order   Maternal Data    Feeding Feeding Type: Breast Fed Length of feed: 10 min  LATCH Score/Interventions Latch: Grasps breast easily, tongue down, lips flanged, rhythmical sucking.  Audible Swallowing: Spontaneous and intermittent Intervention(s): Hand expression Intervention(s): Hand expression;Alternate breast massage  Type of Nipple: Everted at rest and after stimulation  Comfort (Breast/Nipple): Soft / non-tender     Hold (Positioning): Assistance needed to correctly position infant at breast and maintain latch. Intervention(s): Breastfeeding basics reviewed;Support Pillows  LATCH Score: 9  Lactation Tools Discussed/Used     Consult Status Consult Status: Complete    Anne [name] 04/23/2015, 1:49 PM

## 2015-04-23 NOTE — Discharge Summary (Signed)
Obstetric Discharge Summary Reason for Admission: induction of labor d/t A2DM Prenatal Procedures: NST and ultrasound Intrapartum Procedures: cesarean: low cervical, transverse for FTP/failed IOL Postpartum Procedures: none Complications-Operative and Postpartum: none Eating, drinking, voiding, ambulating well.  +flatus and BM.  Lochia and pain wnl.  Denies dizziness, lightheadedness, or sob. No complaints.   Hospital Course: Anne [name] is a 29 y.o. G74P1041 female admited at 38.6wks for IOL d/t A2DM. She was LTC on admission. Despite receiving cytotec, then cervical foley bulb, then pitocin with AROM she only progressed to 4.5/50/-2 by Day 3 of the IOL. She chose to not continue the induction process and opted for PLTCS.  Baby weighed 7lb 5.5oz. Her pp course has been uneventful.  By PPD#2 she is doing well and is deemed to have received the full benefit of her hospital stay.  Filed Vitals:   04/23/15 0613  BP: 129/71  Pulse: 88  Temp: 98 F (36.7 C)  Resp: 18   H/H: Lab Results  Component Value Date/Time   HGB 9.4* 04/22/2015 05:58 AM   HCT 28.9* 04/22/2015 05:58 AM   HCT 37.7 02/01/2015 09:03 AM    Physical Exam: General: alert, cooperative and no distress Abdomen/Uterine Fundus: Appropriately tender, non-distended, FF @ U-2 Incision: clean, dry, intact, honeycomb dressing in place, no signs of infection Lochia: appropriate Extremities: No evidence of DVT seen on physical exam. Negative Homan's sign, no cords, calf tenderness, or significant calf/ankle edema   Discharge Diagnoses: Term Pregnancy-delivered, failed IOL for A2DM  Discharge Information: Date: 04/19/2011 Activity: pelvic rest Diet: routine  Medications: PNV, Ibuprofen, Iron and Percocet Breast feeding: Yes Contraception: undecided, abstinence until contraception rx'd Circumcision: n/a Condition: stable Instructions: refer to handout Discharge to: home  Infant: Home with mother  Follow-up Information     Follow up with [facility]. Schedule an appointment as soon as possible for a visit in 1 week.   Specialty:  Obstetrics and Gynecology   Why:  For wound re-check, then in 4-6wks for your postpartum visit   Contact information:   [place] [phone]     Will need 6-8wk pp 2hr gtt  Tawnya Crook, CNM, WHNP-BC 04/23/2015,12:20 PM

## 2015-04-23 NOTE — Discharge Instructions (Signed)
NO SEX UNTIL AFTER YOU GET YOUR BIRTH CONTROL!!!   Tips To Increase Milk Supply  Lots of water! Enough so that your urine is clear  Plenty of calories, if you're not getting enough calories, your milk supply can decrease  Breastfeed/pump often, every 2-3 hours x 20-39mins  Fenugreek 3 pills 3 times a day, this may make your urine smell like maple syrup  Mother's Milk Tea  Lactation cookies, google for the recipe  Real oatmeal   Cesarean Delivery, Care After Refer to this sheet in the next few weeks. These instructions provide you with information on caring for yourself after your procedure. Your health care provider may also give you specific instructions. Your treatment has been planned according to current medical practices, but problems sometimes occur. Call your health care provider if you have any problems or questions after you go home. HOME CARE INSTRUCTIONS  Only take over-the-counter or prescription medications as directed by your health care provider.  Do not drink alcohol, especially if you are breastfeeding or taking medication to relieve pain.  Do not chew or smoke tobacco.  Continue to use good perineal care. Good perineal care includes:  Wiping your perineum from front to back.  Keeping your perineum clean.  Check your surgical cut (incision) daily for increased redness, drainage, swelling, or separation of skin.  Clean your incision gently with soap and water every day, and then pat it dry. If your health care provider says it is okay, leave the incision uncovered. Use a bandage (dressing) if the incision is draining fluid or appears irritated. If the adhesive strips across the incision do not fall off within 7 days, carefully peel them off.  Hug a pillow when coughing or sneezing until your incision is healed. This helps to relieve pain.  Do not use tampons or douche until your health care provider says it is okay.  Shower, wash your hair, and take tub  baths as directed by your health care provider.  Wear a well-fitting bra that provides breast support.  Limit wearing support panties or control-top hose.  Drink enough fluids to keep your urine clear or pale yellow.  Eat high-fiber foods such as whole grain cereals and breads, brown rice, beans, and fresh fruits and vegetables every day. These foods may help prevent or relieve constipation.  Resume activities such as climbing stairs, driving, lifting, exercising, or traveling as directed by your health care provider.  Talk to your health care provider about resuming sexual activities. This is dependent upon your risk of infection, your rate of healing, and your comfort and desire to resume sexual activity.  Try to have someone help you with your household activities and your newborn for at least a few days after you leave the hospital.  Rest as much as possible. Try to rest or take a nap when your newborn is sleeping.  Increase your activities gradually.  Keep all of your scheduled postpartum appointments. It is very important to keep your scheduled follow-up appointments. At these appointments, your health care provider will be checking to make sure that you are healing physically and emotionally. SEEK MEDICAL CARE IF:   You are passing large clots from your vagina. Save any clots to show your health care provider.  You have a foul smelling discharge from your vagina.  You have trouble urinating.  You are urinating frequently.  You have pain when you urinate.  You have a change in your bowel movements.  You have increasing redness, pain,  or swelling near your incision.  You have pus draining from your incision.  Your incision is separating.  You have painful, hard, or reddened breasts.  You have a severe headache.  You have blurred vision or see spots.  You feel sad or depressed.  You have thoughts of hurting yourself or your newborn.  You have questions about  your care, the care of your newborn, or medications.  You are dizzy or light-headed.  You have a rash.  You have pain, redness, or swelling at the site of the removed intravenous access (IV) tube.  You have nausea or vomiting.  You stopped breastfeeding and have not had a menstrual period within 12 weeks of stopping.  You are not breastfeeding and have not had a menstrual period within 12 weeks of delivery.  You have a fever. SEEK IMMEDIATE MEDICAL CARE IF:  You have persistent pain.  You have chest pain.  You have shortness of breath.  You faint.  You have leg pain.  You have stomach pain.  Your vaginal bleeding saturates 2 or more sanitary pads in 1 hour. MAKE SURE YOU:   Understand these instructions.  Will watch your condition.  Will get help right away if you are not doing well or get worse. Document Released: 06/16/2002 Document Revised: 02/08/2014 Document Reviewed: 05/21/2012 [facility] Patient Information 2015 [place], [place]. This information is not intended to replace advice given to you by your health care provider. Make sure you discuss any questions you have with your health care provider.  Breastfeeding Deciding to breastfeed is one of the best choices you can make for you and your baby. A change in hormones during pregnancy causes your breast tissue to grow and increases the number and size of your milk ducts. These hormones also allow proteins, sugars, and fats from your blood supply to make breast milk in your milk-producing glands. Hormones prevent breast milk from being released before your baby is born as well as prompt milk flow after birth. Once breastfeeding has begun, thoughts of your baby, as well as his or her sucking or crying, can stimulate the release of milk from your milk-producing glands.  BENEFITS OF BREASTFEEDING For Your Baby  Your first milk (colostrum) helps your baby's digestive system function better.   There are antibodies in your  milk that help your baby fight off infections.   Your baby has a lower incidence of asthma, allergies, and sudden infant death syndrome.   The nutrients in breast milk are better for your baby than infant formulas and are designed uniquely for your baby's needs.   Breast milk improves your baby's brain development.   Your baby is less likely to develop other conditions, such as childhood obesity, asthma, or type 2 diabetes mellitus.  For You   Breastfeeding helps to create a very special bond between you and your baby.   Breastfeeding is convenient. Breast milk is always available at the correct temperature and costs nothing.   Breastfeeding helps to burn calories and helps you lose the weight gained during pregnancy.   Breastfeeding makes your uterus contract to its prepregnancy size faster and slows bleeding (lochia) after you give birth.   Breastfeeding helps to lower your risk of developing type 2 diabetes mellitus, osteoporosis, and breast or ovarian cancer later in life. SIGNS THAT YOUR BABY IS HUNGRY Early Signs of Hunger  Increased alertness or activity.  Stretching.  Movement of the head from side to side.  Movement of the head and  opening of the mouth when the corner of the mouth or cheek is stroked (rooting).  Increased sucking sounds, smacking lips, cooing, sighing, or squeaking.  Hand-to-mouth movements.  Increased sucking of fingers or hands. Late Signs of Hunger  Fussing.  Intermittent crying. Extreme Signs of Hunger Signs of extreme hunger will require calming and consoling before your baby will be able to breastfeed successfully. Do not wait for the following signs of extreme hunger to occur before you initiate breastfeeding:   Restlessness.  A loud, strong cry.   Screaming. BREASTFEEDING BASICS Breastfeeding Initiation  Find a comfortable place to sit or lie down, with your neck and back well supported.  Place a pillow or rolled up  blanket under your baby to bring him or her to the level of your breast (if you are seated). Nursing pillows are specially designed to help support your arms and your baby while you breastfeed.  Make sure that your baby's abdomen is facing your abdomen.   Gently massage your breast. With your fingertips, massage from your chest wall toward your nipple in a circular motion. This encourages milk flow. You may need to continue this action during the feeding if your milk flows slowly.  Support your breast with 4 fingers underneath and your thumb above your nipple. Make sure your fingers are well away from your nipple and your baby's mouth.   Stroke your baby's lips gently with your finger or nipple.   When your baby's mouth is open wide enough, quickly bring your baby to your breast, placing your entire nipple and as much of the colored area around your nipple (areola) as possible into your baby's mouth.   More areola should be visible above your baby's upper lip than below the lower lip.   Your baby's tongue should be between his or her lower gum and your breast.   Ensure that your baby's mouth is correctly positioned around your nipple (latched). Your baby's lips should create a seal on your breast and be turned out (everted).  It is common for your baby to suck about 2-3 minutes in order to start the flow of breast milk. Latching Teaching your baby how to latch on to your breast properly is very important. An improper latch can cause nipple pain and decreased milk supply for you and poor weight gain in your baby. Also, if your baby is not latched onto your nipple properly, he or she may swallow some air during feeding. This can make your baby fussy. Burping your baby when you switch breasts during the feeding can help to get rid of the air. However, teaching your baby to latch on properly is still the best way to prevent fussiness from swallowing air while breastfeeding. Signs that your  baby has successfully latched on to your nipple:    Silent tugging or silent sucking, without causing you pain.   Swallowing heard between every 3-4 sucks.    Muscle movement above and in front of his or her ears while sucking.  Signs that your baby has not successfully latched on to nipple:   Sucking sounds or smacking sounds from your baby while breastfeeding.  Nipple pain. If you think your baby has not latched on correctly, slip your finger into the corner of your baby's mouth to break the suction and place it between your baby's gums. Attempt breastfeeding initiation again. Signs of Successful Breastfeeding Signs from your baby:   A gradual decrease in the number of sucks or complete cessation  of sucking.   Falling asleep.   Relaxation of his or her body.   Retention of a small amount of milk in his or her mouth.   Letting go of your breast by himself or herself. Signs from you:  Breasts that have increased in firmness, weight, and size 1-3 hours after feeding.   Breasts that are softer immediately after breastfeeding.  Increased milk volume, as well as a change in milk consistency and color by the fifth day of breastfeeding.   Nipples that are not sore, cracked, or bleeding. Signs That Your [employer] is Getting Enough Milk  Wetting at least 3 diapers in a 24-hour period. The urine should be clear and pale yellow by age 60 days.  At least 3 stools in a 24-hour period by age 60 days. The stool should be soft and yellow.  At least 3 stools in a 24-hour period by age 65 days. The stool should be seedy and yellow.  No loss of weight greater than 10% of birth weight during the first 73 days of age.  Average weight gain of 4-7 ounces (113-198 g) per week after age 37 days.  Consistent daily weight gain by age 35 days, without weight loss after the age of 2 weeks. After a feeding, your baby may spit up a small amount. This is common. BREASTFEEDING FREQUENCY AND  DURATION Frequent feeding will help you make more milk and can prevent sore nipples and breast engorgement. Breastfeed when you feel the need to reduce the fullness of your breasts or when your baby shows signs of hunger. This is called "breastfeeding on demand." Avoid introducing a pacifier to your baby while you are working to establish breastfeeding (the first 4-6 weeks after your baby is born). After this time you may choose to use a pacifier. Research has shown that pacifier use during the first year of a baby's life decreases the risk of sudden infant death syndrome (SIDS). Allow your baby to feed on each breast as long as he or she wants. Breastfeed until your baby is finished feeding. When your baby unlatches or falls asleep while feeding from the first breast, offer the second breast. Because newborns are often sleepy in the first few weeks of life, you may need to awaken your baby to get him or her to feed. Breastfeeding times will vary from baby to baby. However, the following rules can serve as a guide to help you ensure that your baby is properly fed:  Newborns (babies 59 weeks of age or younger) may breastfeed every 1-3 hours.  Newborns should not go longer than 3 hours during the day or 5 hours during the night without breastfeeding.  You should breastfeed your baby a minimum of 8 times in a 24-hour period until you begin to introduce solid foods to your baby at around 51 months of age. BREAST MILK PUMPING Pumping and storing breast milk allows you to ensure that your baby is exclusively fed your breast milk, even at times when you are unable to breastfeed. This is especially important if you are going back to work while you are still breastfeeding or when you are not able to be present during feedings. Your lactation consultant can give you guidelines on how long it is safe to store breast milk.  A breast pump is a machine that allows you to pump milk from your breast into a sterile bottle.  The pumped breast milk can then be stored in a refrigerator or freezer. Some  breast pumps are operated by hand, while others use electricity. Ask your lactation consultant which type will work best for you. Breast pumps can be purchased, but some hospitals and breastfeeding support groups lease breast pumps on a monthly basis. A lactation consultant can teach you how to hand express breast milk, if you prefer not to use a pump.  CARING FOR YOUR BREASTS WHILE YOU BREASTFEED Nipples can become dry, cracked, and sore while breastfeeding. The following recommendations can help keep your breasts moisturized and healthy:  Avoid using soap on your nipples.   Wear a supportive bra. Although not required, special nursing bras and tank tops are designed to allow access to your breasts for breastfeeding without taking off your entire bra or top. Avoid wearing underwire-style bras or extremely tight bras.  Air dry your nipples for 3-38minutes after each feeding.   Use only cotton bra pads to absorb leaked breast milk. Leaking of breast milk between feedings is normal.   Use lanolin on your nipples after breastfeeding. Lanolin helps to maintain your skin's normal moisture barrier. If you use pure lanolin, you do not need to wash it off before feeding your baby again. Pure lanolin is not toxic to your baby. You may also hand express a few drops of breast milk and gently massage that milk into your nipples and allow the milk to air dry. In the first few weeks after giving birth, some women experience extremely full breasts (engorgement). Engorgement can make your breasts feel heavy, warm, and tender to the touch. Engorgement peaks within 3-5 days after you give birth. The following recommendations can help ease engorgement:  Completely empty your breasts while breastfeeding or pumping. You may want to start by applying warm, moist heat (in the shower or with warm water-soaked hand towels) just before feeding or  pumping. This increases circulation and helps the milk flow. If your baby does not completely empty your breasts while breastfeeding, pump any extra milk after he or she is finished.  Wear a snug bra (nursing or regular) or tank top for 1-2 days to signal your body to slightly decrease milk production.  Apply ice packs to your breasts, unless this is too uncomfortable for you.  Make sure that your baby is latched on and positioned properly while breastfeeding. If engorgement persists after 48 hours of following these recommendations, contact your health care provider or a Science writer. OVERALL HEALTH CARE RECOMMENDATIONS WHILE BREASTFEEDING  Eat healthy foods. Alternate between meals and snacks, eating 3 of each per day. Because what you eat affects your breast milk, some of the foods may make your baby more irritable than usual. Avoid eating these foods if you are sure that they are negatively affecting your baby.  Drink milk, fruit juice, and water to satisfy your thirst (about 10 glasses a day).   Rest often, relax, and continue to take your prenatal vitamins to prevent fatigue, stress, and anemia.  Continue breast self-awareness checks.  Avoid chewing and smoking tobacco.  Avoid alcohol and drug use. Some medicines that may be harmful to your baby can pass through breast milk. It is important to ask your health care provider before taking any medicine, including all over-the-counter and prescription medicine as well as vitamin and herbal supplements. It is possible to become pregnant while breastfeeding. If birth control is desired, ask your health care provider about options that will be safe for your baby. SEEK MEDICAL CARE IF:   You feel like you want to stop  breastfeeding or have become frustrated with breastfeeding.  You have painful breasts or nipples.  Your nipples are cracked or bleeding.  Your breasts are red, tender, or warm.  You have a swollen area on either  breast.  You have a fever or chills.  You have nausea or vomiting.  You have drainage other than breast milk from your nipples.  Your breasts do not become full before feedings by the fifth day after you give birth.  You feel sad and depressed.  Your baby is too sleepy to eat well.  Your baby is having trouble sleeping.   Your baby is wetting less than 3 diapers in a 24-hour period.  Your baby has less than 3 stools in a 24-hour period.  Your baby's skin or the white part of his or her eyes becomes yellow.   Your baby is not gaining weight by 75 days of age. SEEK IMMEDIATE MEDICAL CARE IF:   Your baby is overly tired (lethargic) and does not want to wake up and feed.  Your baby develops an unexplained fever. Document Released: 09/24/2005 Document Revised: 09/29/2013 Document Reviewed: 03/18/2013 [facility] Patient Information 2015 [place], [place]. This information is not intended to replace advice given to you by your health care provider. Make sure you discuss any questions you have with your health care provider.

## 2015-04-24 ENCOUNTER — Encounter (HOSPITAL_COMMUNITY): Payer: Self-pay | Admitting: Obstetrics and Gynecology

## 2015-04-26 ENCOUNTER — Encounter: Payer: Self-pay | Admitting: Women's Health

## 2015-04-26 ENCOUNTER — Ambulatory Visit (INDEPENDENT_AMBULATORY_CARE_PROVIDER_SITE_OTHER): Payer: Medicaid Other | Admitting: Women's Health

## 2015-04-26 VITALS — BP 102/78 | HR 68 | Wt 229.0 lb

## 2015-04-26 DIAGNOSIS — N6459 Other signs and symptoms in breast: Secondary | ICD-10-CM | POA: Insufficient documentation

## 2015-04-26 DIAGNOSIS — Z5189 Encounter for other specified aftercare: Secondary | ICD-10-CM

## 2015-04-26 NOTE — Progress Notes (Signed)
Patient ID: Anne [name], female   DOB: [birth year], 29 y.o.   MRN: [id number]   [facility] Visit  Patient name: Anne [name] MRN [id number]  Date of birth: [birth year]  CC & HPI:  Anne [name] is a 29 y.o. [phone] Caucasian female 31d s/p PLTCS presenting today for incision check- noticed some drainage on Lt side, honeycomb dressing still in place. Baby quit latching when milk came in, since breasts have gotten hard and red. No fever/chills. Massages them in shower, pumps often and it does help breasts. Has appt w/ LC on Thurs. Not having to take anything stronger than ibuprofen.   Pertinent History Reviewed:  Medical & Surgical Hx:   Past Medical History  Diagnosis Date  . PCOS (polycystic ovarian syndrome)   . Hidradenitis suppurativa   . Pregnant 10/25/2014  . Gestational diabetes mellitus, antepartum    Past Surgical History  Procedure Laterality Date  . Tube thoracotomy    . Cesarean section N/A 04/21/2015    Procedure: CESAREAN SECTION;  Surgeon: Jonnie Kind, MD;  Location: [facility];  Service: Obstetrics;  Laterality: N/A;   Medications: Reviewed & Updated - see associated section Social History: Reviewed -  reports that she has quit smoking. Her smoking use included Cigarettes. She has never used smokeless tobacco.  Objective Findings:  Vitals: BP 102/78 mmHg  Pulse 68  Wt 229 lb (103.874 kg)  LMP 07/10/2014 (Approximate)  Breastfeeding? Yes  Physical Examination: General appearance - alert, well appearing, and in no distress Abdomen - honeycomb dressing removed w/ drainage on Lt of dressing, steri strips removed, incision healing very well, approximated, no erythema, induration or signs of infection, no active drainage.  Breasts- slightly red bilaterally on inner aspects, not hot to touch, no induration, breasts uniformly lumpy w/ milk- do not suspect mastitis, more likely engorgement   No results found for this or any previous visit (from the past 24  hour(s)).   Assessment & Plan:  A:   5d s/p PLTCS  Incision check  Bilateral breast engorgement P:  Keep incision clean and dry- run warm soapy water over incision in shower, then put peripads ontop of incision/under panus to help keep incision dry  Continue to pump q 2-3hrs for 20-64mins, get electric pump, continue to try to latch infant prior to pumping, let warm water run over breasts in shower while massage them, keep appt w/ LC on Thurs  Call if any worsening sx, fever/chills, s/s infection  F/U 4-6wks as scheduled for pp visit   Tawnya Crook CNM, [facility] 04/26/2015 3:34 PM

## 2015-04-26 NOTE — Patient Instructions (Signed)
Pump every 2-3 hours for 20-55minutes Let warm water run over breasts in shower and massage them at same time Call if fever/chills, pain in breasts  Let warm soapy water run over incision in shower Keep incision clean and dry, can put pads at incision site Call if fever/chills, bad odor to drainage

## 2015-05-03 NOTE — Progress Notes (Signed)
This encounter was created in error - please disregard.

## 2015-05-11 ENCOUNTER — Ambulatory Visit: Payer: Medicaid Other | Admitting: Obstetrics and Gynecology

## 2015-05-13 ENCOUNTER — Encounter: Payer: Self-pay | Admitting: Obstetrics and Gynecology

## 2015-05-13 ENCOUNTER — Ambulatory Visit (INDEPENDENT_AMBULATORY_CARE_PROVIDER_SITE_OTHER): Payer: Medicaid Other | Admitting: Obstetrics and Gynecology

## 2015-05-13 VITALS — BP 100/62 | HR 84 | Ht 63.0 in | Wt 224.0 lb

## 2015-05-13 DIAGNOSIS — R300 Dysuria: Secondary | ICD-10-CM | POA: Diagnosis not present

## 2015-05-13 LAB — POCT URINALYSIS DIPSTICK
GLUCOSE UA: NEGATIVE
Ketones, UA: NEGATIVE
LEUKOCYTES UA: NEGATIVE
Nitrite, UA: NEGATIVE
Protein, UA: NEGATIVE

## 2015-05-13 MED ORDER — NORETHIN ACE-ETH ESTRAD-FE 1-20 MG-MCG(24) PO TABS: 1.0000 | ORAL_TABLET | Freq: Every day | ORAL | Status: DC

## 2015-05-13 MED ORDER — NORETHINDRONE 0.35 MG PO TABS: 1.0000 | ORAL_TABLET | Freq: Every day | ORAL | Status: DC

## 2015-05-13 NOTE — Progress Notes (Signed)
[facility] Visit  Patient name: Anne [name] MRN [id number]  Date of birth: 07/29/1986  CC & HPI:  Anne [name] is a 29 y.o. female presenting today for her 4 week postpartum visit and incision check for Cesarean delivery. She is interested in using OCP's for contraception, although she stopped using OCP's 9 years due to side effects of projectile vomiting.   She also complains of mild dysuria, although she states this is much more mild than when she had a UTI recently. Patient just finished a course of Cipro 3 days ago for a UTI.   Patient notes right wrist pain, currently treated with a splint, due to carpal tunnel.   ROS:  A complete 10 system review of systems was obtained and all systems are negative except as noted in the HPI and PMH.    Pertinent History Reviewed:   Reviewed: Significant for Cesarean section Medical         Past Medical History  Diagnosis Date  . PCOS (polycystic ovarian syndrome)   . Hidradenitis suppurativa   . Pregnant 10/25/2014  . Gestational diabetes mellitus, antepartum                               Surgical Hx:    Past Surgical History  Procedure Laterality Date  . Tube thoracotomy    . Cesarean section N/A 04/21/2015    Procedure: CESAREAN SECTION;  Surgeon: Jonnie Kind, MD;  Location: [facility];  Service: Obstetrics;  Laterality: N/A;   Medications: Reviewed & Updated - see associated section                       Current outpatient prescriptions:  .  ferrous sulfate 325 (65 FE) MG tablet, Take 1 tablet (325 mg total) by mouth 2 (two) times daily with a meal., Disp: 60 tablet, Rfl: 3 .  ibuprofen (ADVIL,MOTRIN) 600 MG tablet, Take 1 tablet (600 mg total) by mouth every 6 (six) hours as needed for mild pain, moderate pain or cramping., Disp: 30 tablet, Rfl: 0 .  prenatal vitamin w/FE, FA (PRENATAL 1 + 1) 27-1 MG TABS tablet, Take 1 tablet by mouth daily at 12 noon. (Patient not taking: Reported on 05/13/2015), Disp: 30 each, Rfl:  30   Social History: Reviewed -  reports that she has quit smoking. Her smoking use included Cigarettes. She has never used smokeless tobacco.  Objective Findings:  Vitals: Blood pressure 100/62, pulse 84, height 5\' 3"  (1.6 m), weight 224 lb (101.606 kg), not currently breastfeeding.  Physical Examination: General appearance - alert, well appearing, and in no distress, oriented to person, place, and time and overweight Mental status - alert, oriented to person, place, and time, normal mood, behavior, speech, dress, motor activity, and thought processes, affect appropriate to mood Abdomen - beautifully healing incision  Pelvic - normal external genitalia, vulva, vagina, cervix, uterus and adnexa,  VULVA: normal appearing vulva with no masses, tenderness or lesions,  VAGINA: normal appearing vagina with normal color and discharge, no lesions,  CERVIX: normal appearing cervix without discharge or lesions   Urinalysis positive for 2+ blood  Assessment & Plan:   A:  1. Normal postpartum check no depressive sx. 2. Recovering carpal tunnel syndrome symptoms  P:  1. Routine postpartum care. Return to work upon resolution of carpal tunnel symptoms.    This chart was SCRIBED for Mallory Shirk,  MD by Stephania Fragmin, ED Scribe. This patient was seen in room 1, and the patient's care was started at 12:21 PM.  I personally performed the services described in this documentation, which was SCRIBED in my presence. The recorded information has been reviewed and considered accurate. It has been edited as necessary during review. Jonnie Kind, MD

## 2015-08-04 NOTE — Progress Notes (Signed)
This encounter was created in error - please disregard.

## 2015-08-23 ENCOUNTER — Other Ambulatory Visit (HOSPITAL_COMMUNITY): Payer: Self-pay | Admitting: Internal Medicine

## 2015-08-23 DIAGNOSIS — R1011 Right upper quadrant pain: Secondary | ICD-10-CM

## 2015-08-25 ENCOUNTER — Encounter (HOSPITAL_COMMUNITY): Payer: Self-pay

## 2016-01-06 ENCOUNTER — Encounter (HOSPITAL_COMMUNITY): Payer: Self-pay

## 2016-02-11 IMAGING — US US ABDOMEN LIMITED
1 series · 14 of 25 positions shown · non-contrast
Comparison: None.

CLINICAL DATA: Right upper quadrant pain, nausea/vomiting, 36 weeks
pregnant

EXAM:
US ABDOMEN LIMITED - RIGHT UPPER QUADRANT

[Series 1: us abdomen limited · 14 of 38 slices shown]
[im 1/38]
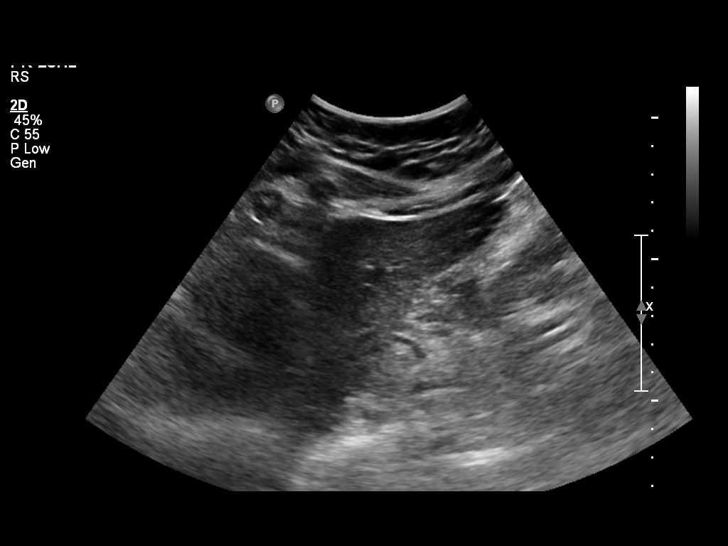
[im 4/38]
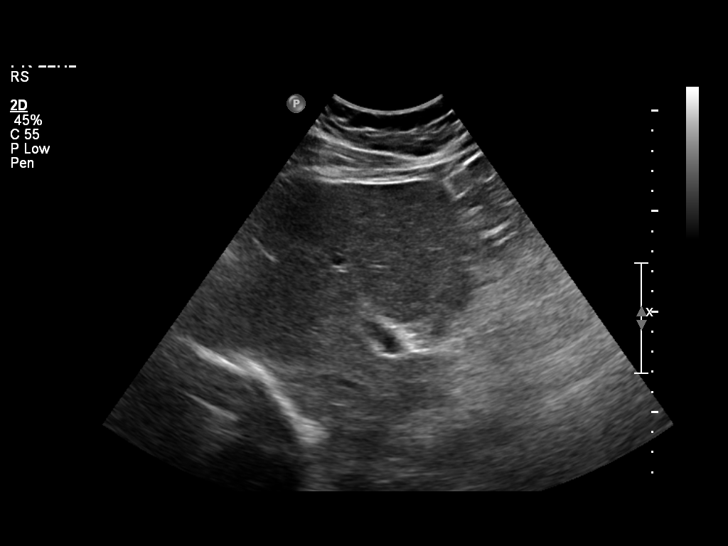
[im 7/38]
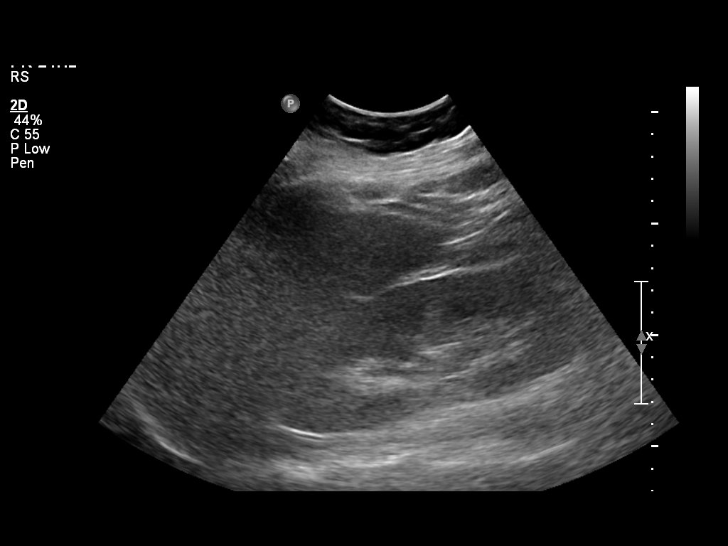
[im 10/38]
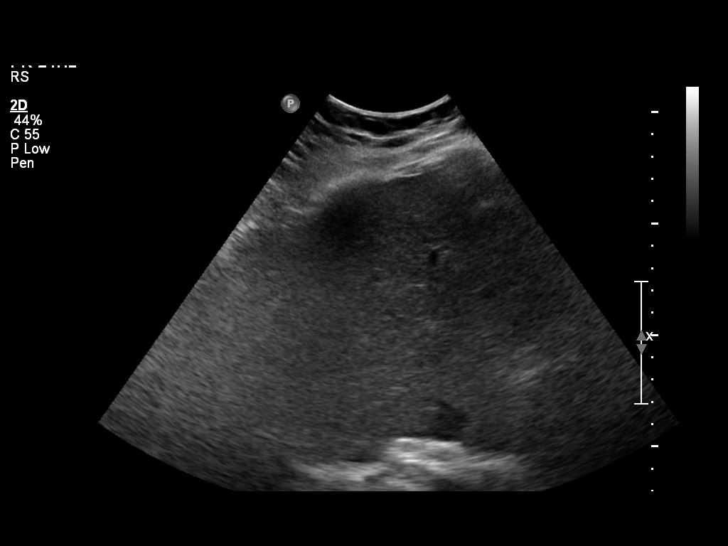
[im 13/38]
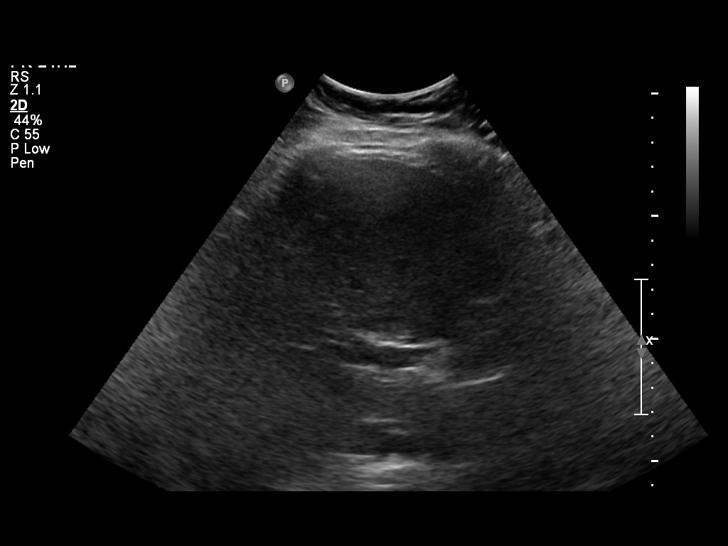
[im 14/38]
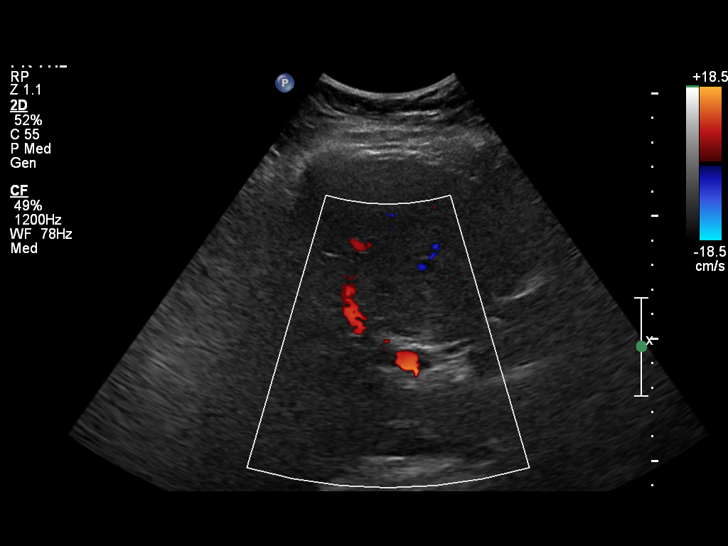
[im 17/38]
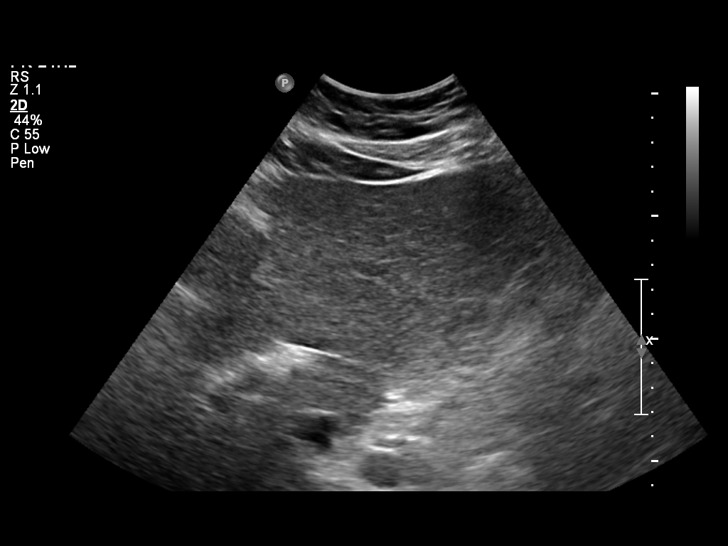
[im 21/38]
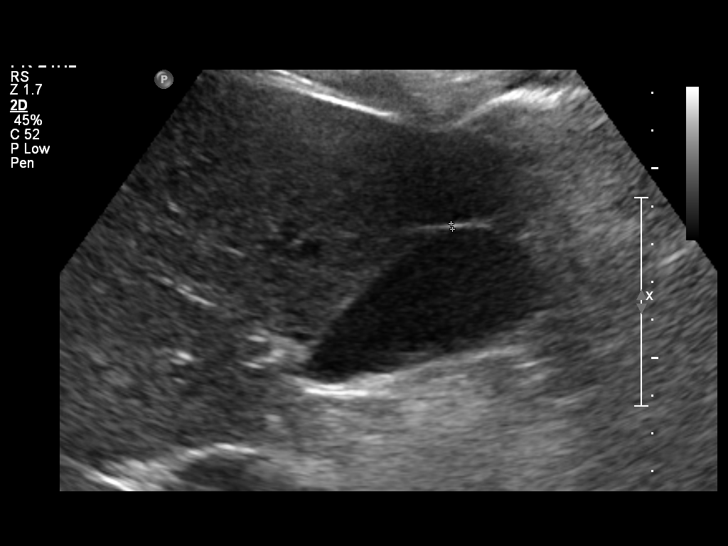
[im 24/38]
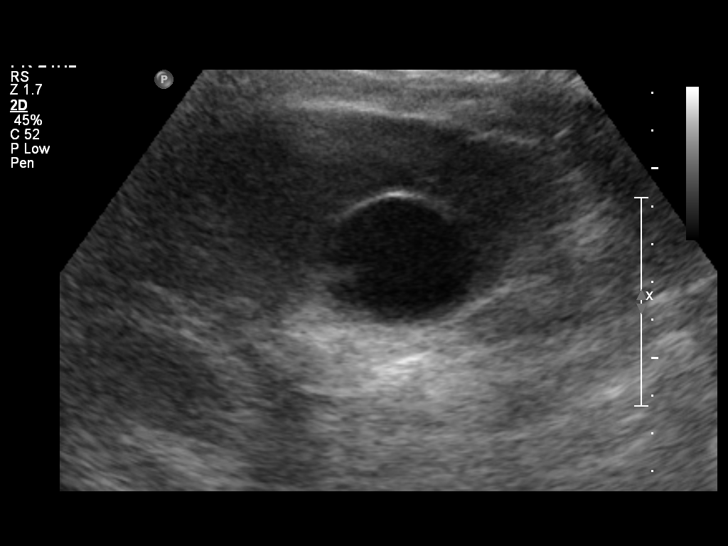
[im 25/38]
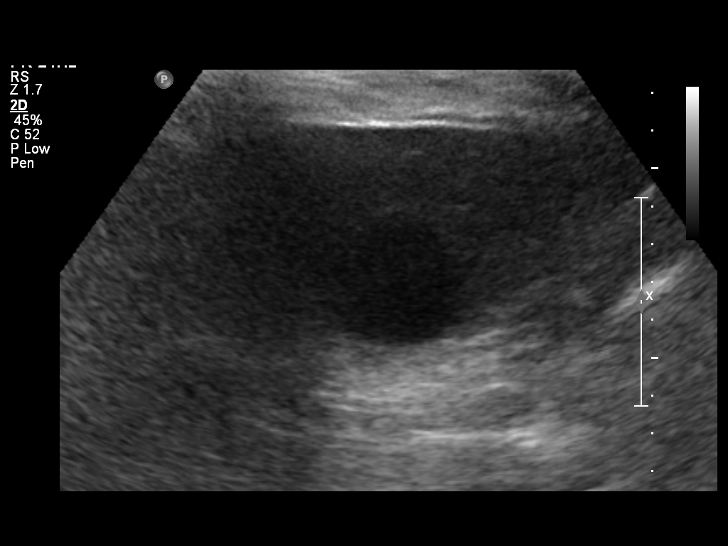
[im 28/38]
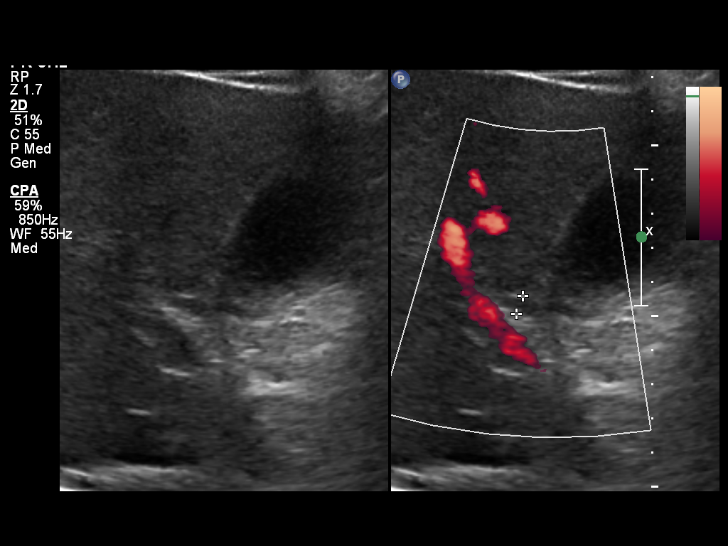
[im 31/38]
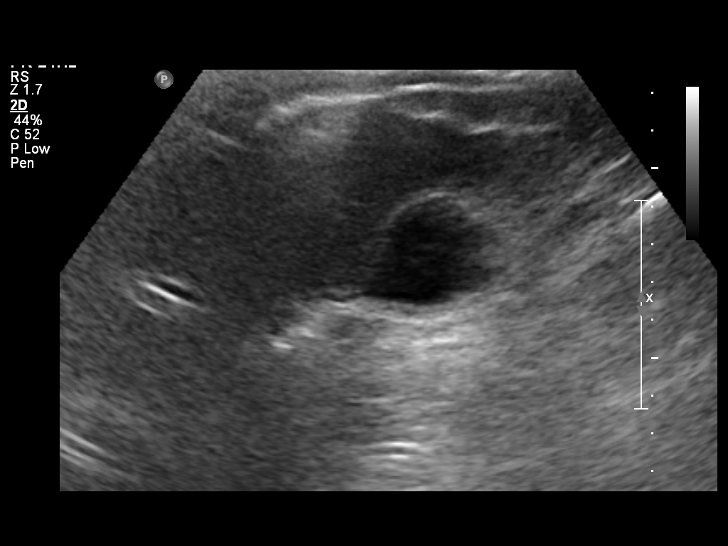
[im 34/38]
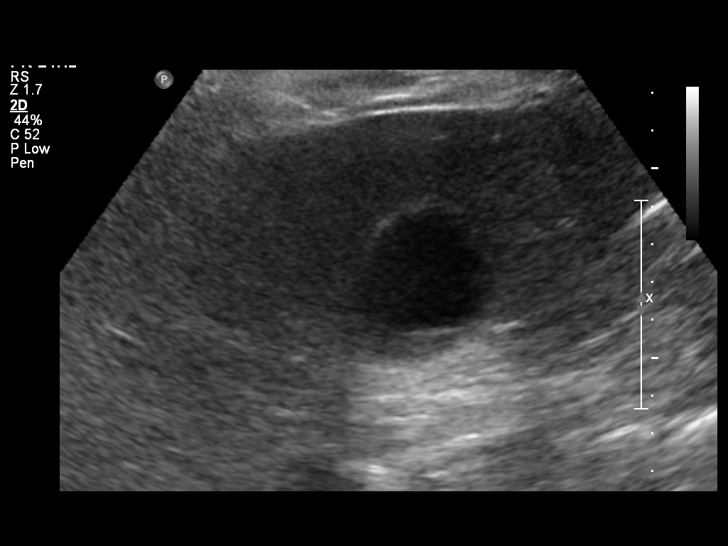
[im 38/38]
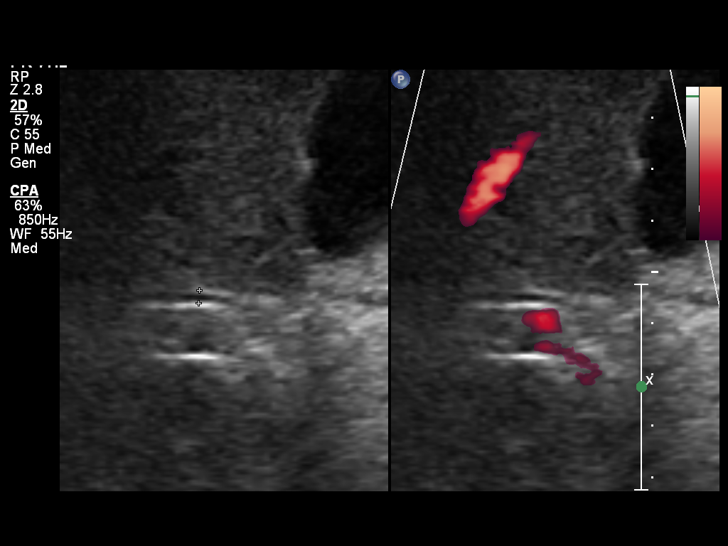

[14 of 25 positions shown; findings below may reference images not displayed]

FINDINGS: Gallbladder:

No gallstones, gallbladder wall thickening, or pericholecystic
fluid.

Common bile duct:

Diameter: 5 mm

Liver:

No focal lesion identified. At the upper limits of normal for
parenchymal echogenicity.
IMPRESSION: Negative right upper quadrant ultrasound.

## 2016-02-12 IMAGING — MR MR ABDOMEN W/O CM
7 of 12 series · 19 of 48 positions shown · non-contrast
Comparison: None

CLINICAL DATA: Right upper quadrant pain right upper quadrant pain.
Evaluate for appendicitis.

EXAM:
MRI ABDOMEN AND PELVIS WITHOUT CONTRAST
TECHNIQUE: Multiplanar multisequence MR imaging of the abdomen and pelvis was
performed. No intravenous contrast was administered.

[Series 6: T2 · coronal · 6.0mm · 0.88mm/px · 3 of 39 slices shown (1 of 2)]
[im 1/39]
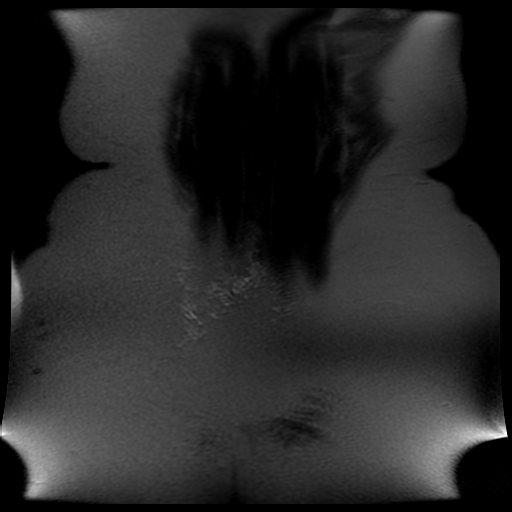
[im 20/39]
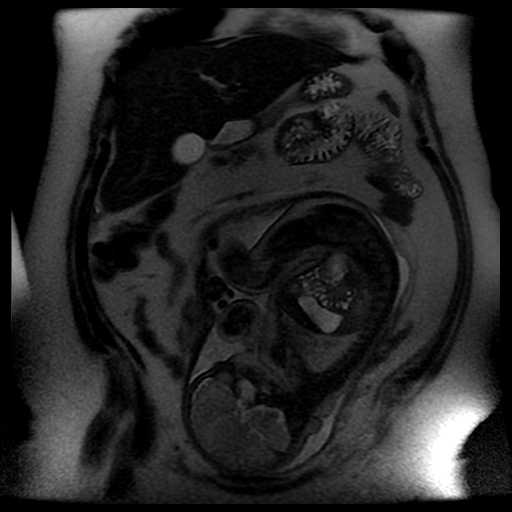
[im 39/39]
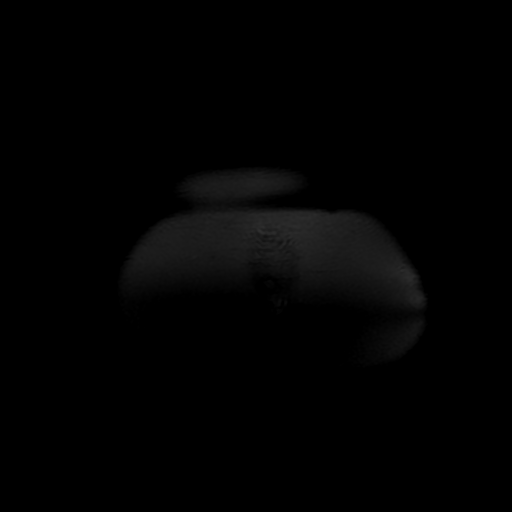

[Series 7: T2 fat-sat · coronal · 6.0mm · 0.88mm/px · 3 of 39 slices shown (1 of 2)]
[im 1/39]
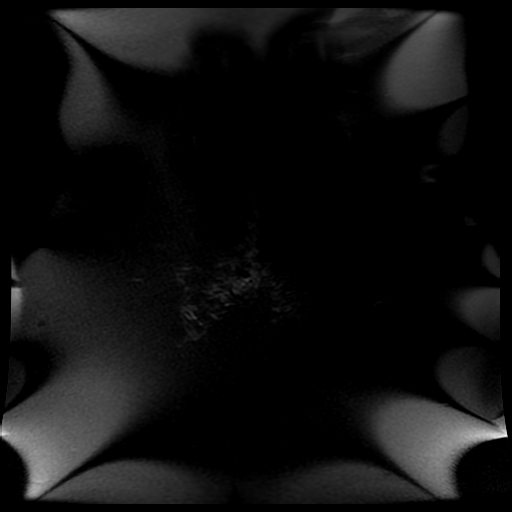
[im 20/39]
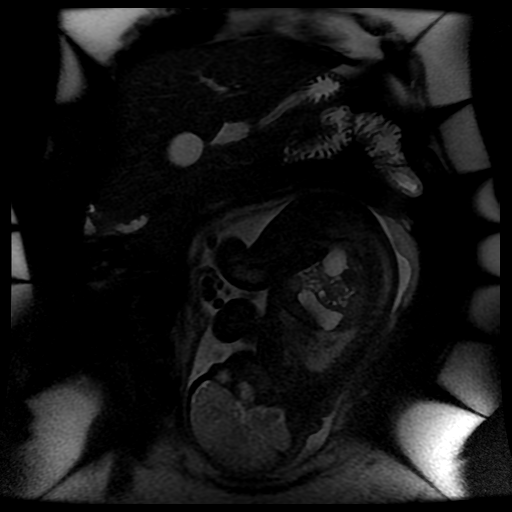
[im 39/39]
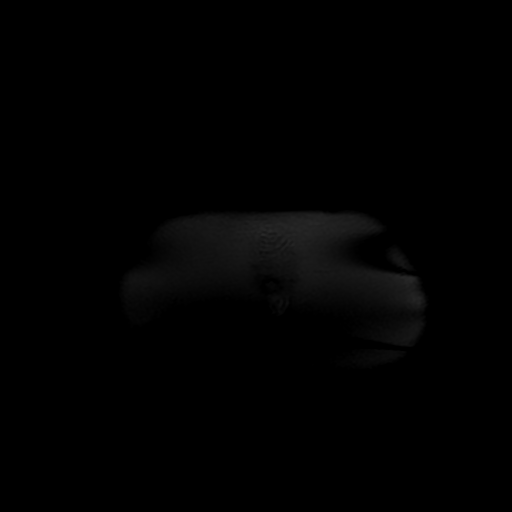

[Series 8: bSSFP · coronal · 5.0mm · 0.88mm/px · 3 of 46 slices shown (1 of 3)]
[im 1/46]
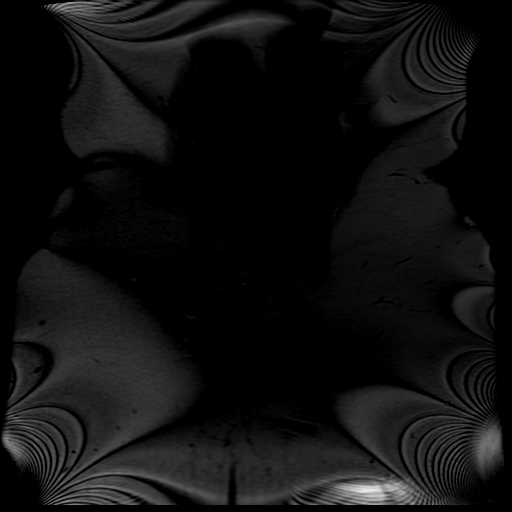
[im 23/46]
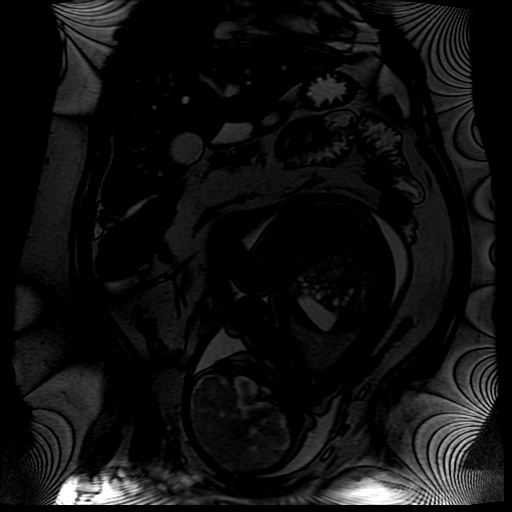
[im 46/46]
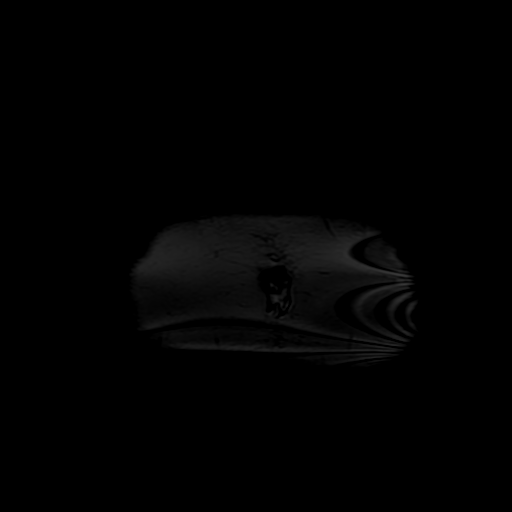

[Series 9: bSSFP · axial · 5.0mm · 0.78mm/px · z∈[-52,+224]mm · 3 of 47 slices shown (2 of 3)]
[im 1/47]
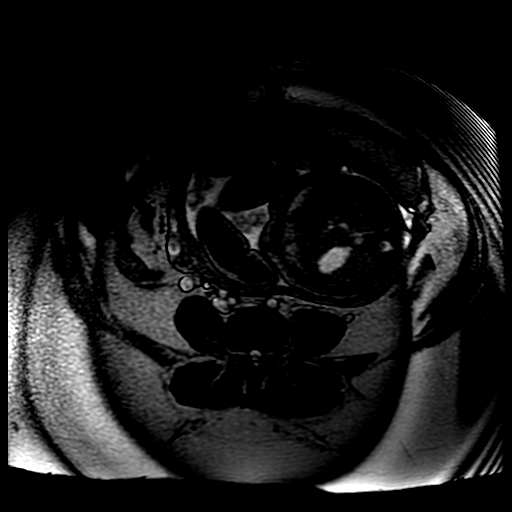
[im 24/47]
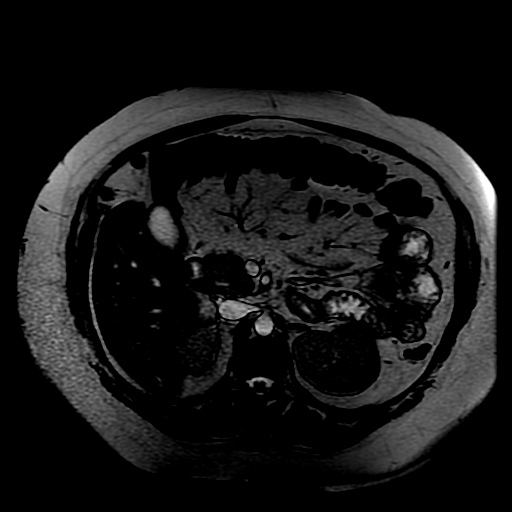
[im 47/47]
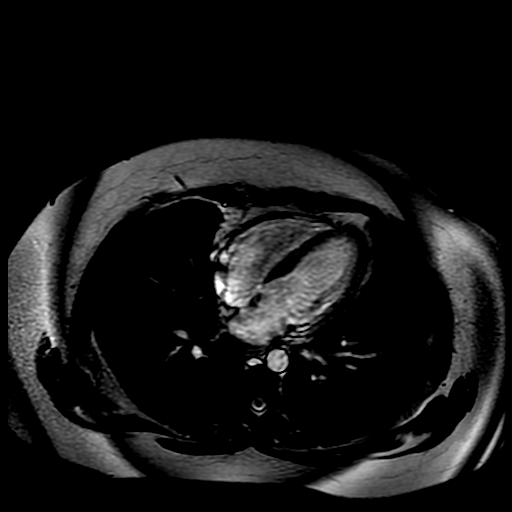

[Series 10: bSSFP · axial · 5.0mm · 0.78mm/px · z∈[-226,-10]mm · 3 of 37 slices shown (3 of 3)]
[im 1/37]
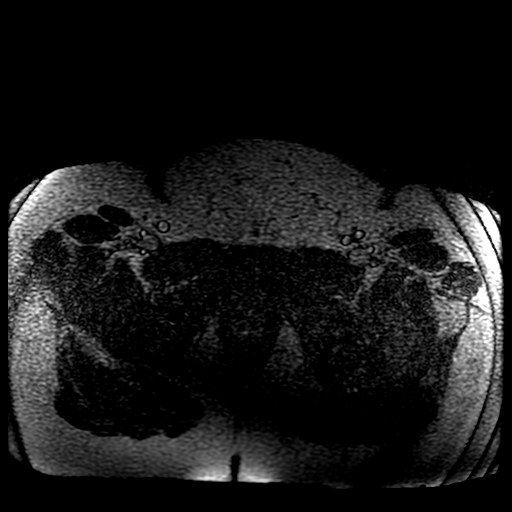
[im 19/37]
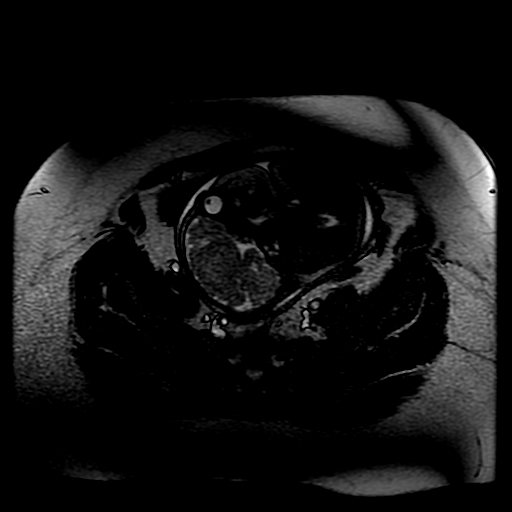
[im 37/37]
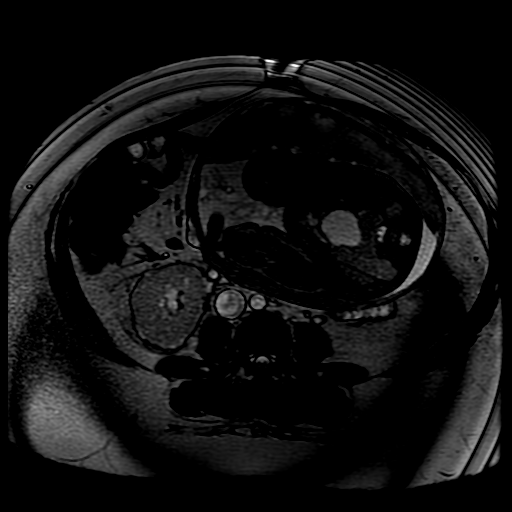

[Series 11: T2 · axial · 5.0mm · 0.78mm/px · z∈[-52,+224]mm · 3 of 47 slices shown (2 of 2)]
[im 1/47]
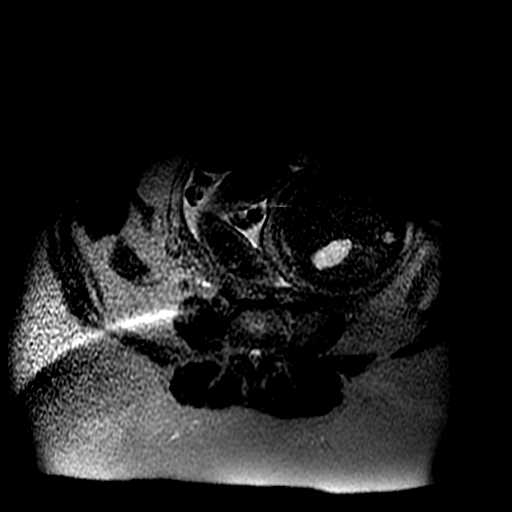
[im 24/47]
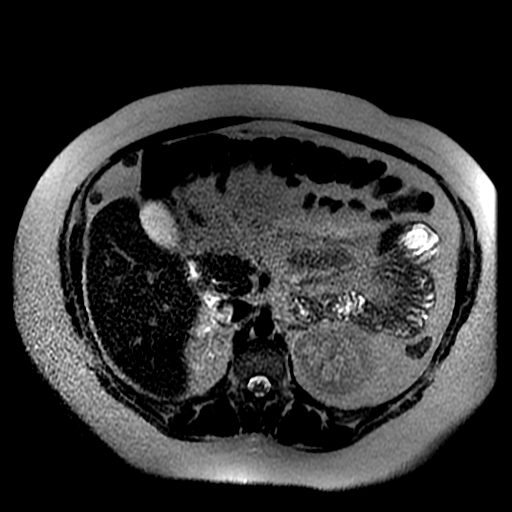
[im 47/47]
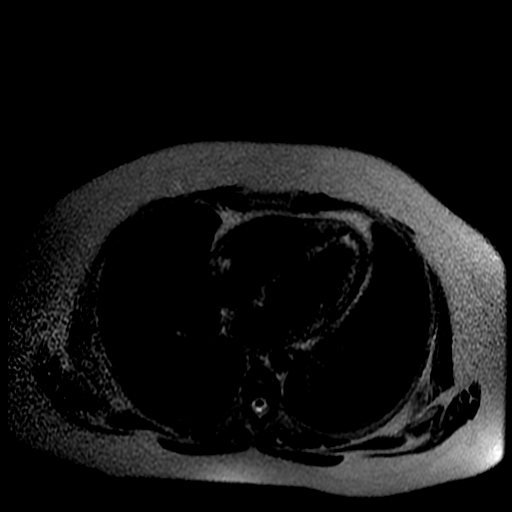

[Series 12: T2 fat-sat · axial · 5.0mm · 0.78mm/px · 1 of 47 slices shown (2 of 2)]
[im 1/47]
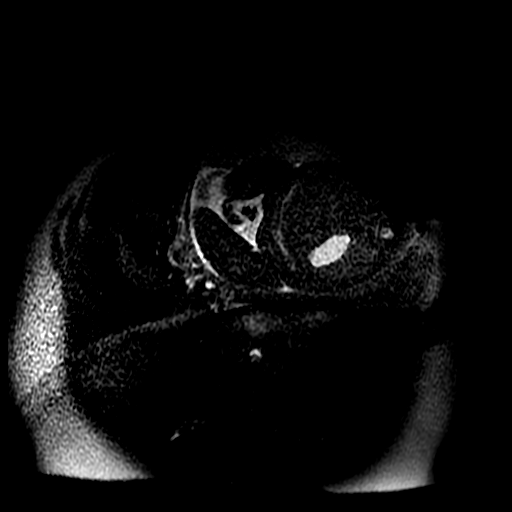

[19 of 48 positions shown; findings below may reference images not displayed]

FINDINGS: MRI ABDOMEN FINDINGS

Lower chest:  No pleural or pericardial effusion noted.

Hepatobiliary: No suspicious liver abnormality. The gallbladder is
normal. There is no biliary dilatation.

Pancreas: Normal appearance of the pancreas.

Spleen: Normal appearance of the spleen.

Adrenals/Urinary Tract: The adrenal glands are both within normal
limits. The left kidney is normal. Mild right hydronephrosis likely
secondary to gravid uterus.

Stomach/Bowel: The stomach and upper abdominal bowel loops are
unremarkable.

Vascular/Lymphatic: Normal appearance of the abdominal aorta. No
adenopathy.

Other: No free fluid or fluid collections noted.

Musculoskeletal: Normal signal from within the bone marrow.

MRI PELVIS FINDINGS

Urinary Tract: Urinary bladder appears collapsed.

Bowel: Retrocecal appendix is noted and appears within normal
limits. B pelvic bowel loops are on unremarkable.

Vascular/Lymphatic: Iliac vessels are normal. No pelvic or inguinal
adenopathy.

Reproductive: Gravid uterus.

Other: None

Musculoskeletal: Normal signal from within the bone marrow.
IMPRESSION: No evidence for acute appendicitis.

Mild right hydronephrosis of pregnancy.

## 2018-08-08 ENCOUNTER — Other Ambulatory Visit: Payer: Self-pay | Admitting: Family Medicine

## 2018-08-08 ENCOUNTER — Ambulatory Visit
Admission: RE | Admit: 2018-08-08 | Discharge: 2018-08-08 | Disposition: A | Discharge status: Home / Self Care | Payer: No Typology Code available for payment source | Source: Ambulatory Visit | Attending: Family Medicine | Admitting: Family Medicine

## 2018-08-08 DIAGNOSIS — M549 Dorsalgia, unspecified: Secondary | ICD-10-CM

## 2018-08-08 DIAGNOSIS — M542 Cervicalgia: Secondary | ICD-10-CM

## 2020-03-16 ENCOUNTER — Ambulatory Visit: Payer: 59 | Admitting: Adult Health

## 2020-03-16 ENCOUNTER — Encounter: Payer: Self-pay | Admitting: Adult Health

## 2020-03-16 VITALS — BP 105/65 | HR 91 | Ht 63.0 in | Wt 204.5 lb

## 2020-03-16 DIAGNOSIS — N9089 Other specified noninflammatory disorders of vulva and perineum: Secondary | ICD-10-CM

## 2020-03-16 DIAGNOSIS — R238 Other skin changes: Secondary | ICD-10-CM

## 2020-03-16 MED ORDER — SULFAMETHOXAZOLE-TRIMETHOPRIM 800-160 MG PO TABS: 1.0000 | ORAL_TABLET | Freq: Two times a day (BID) | ORAL | 0 refills | Status: DC

## 2020-03-16 MED ORDER — VALACYCLOVIR HCL 1 G PO TABS: 1000.0000 mg | ORAL_TABLET | Freq: Two times a day (BID) | ORAL | 1 refills | Status: DC

## 2020-03-16 NOTE — Progress Notes (Addendum)
  Subjective:     Patient ID: Anne [name], female   DOB: 1986-03-21, 34 y.o.   MRN: [id number]  HPI Anne [name] is a 34 year old white female, has 36 year partner, [id number] in complaining of pain and swelling of right labia, hurts to walk. She has lost 40 lbs in 4 months, on keto.  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   Review of Systems Has area right labia that hurts and is swollen for almost 2 weeks, felt like there was something sticking out of vagina Reviewed past medical,surgical, social and family history. Reviewed medications and allergies.     Objective:   Physical Exam BP 105/65 (BP Location: Left Arm, Patient Position: Sitting, Cuff Size: Normal)   Pulse 91   Ht 5\' 3"  (1.6 m)   Wt 204 lb 8 oz (92.8 kg)   LMP 03/03/2020   BMI 36.23 kg/m   AA 1 Fall risk is low PHQ 9 score is 2 Skin warm and dry.Pelvic: external genitalia is normal in appearance, on inner labia has vesicle and 1 cm swollen area, HSV culture obtained, vagina: pink, with good moisture and enlarged papillae at introitus and fordyce spots that appear white, on both inner labia, non tender ,urethra has no lesions or masses noted, cervix:smooth and bulbous, uterus: normal size, shape and contour, non tender, no masses felt, adnexa: no masses or tenderness noted. Bladder is non tender and no masses felt Examination chaperoned by Levy Pupa LPN    Assessment:     1. Vesicle of skin HSV culture obtained Will rx valtrex   2. Labial irritation, enlarged papillae Will rx septra ds Meds ordered this encounter  Medications  . valACYclovir (VALTREX) 1000 MG tablet    Sig: Take 1 tablet (1,000 mg total) by mouth 2 (two) times daily.    Dispense:  20 tablet    Refill:  1    Order Specific Question:   Supervising Provider    Answer:   Elonda Husky, LUTHER H [[id number]]  . sulfamethoxazole-trimethoprim (BACTRIM DS) 800-160 MG tablet    Sig: Take 1 tablet by mouth 2 (two) times daily. Take 1 bid    Dispense:  28 tablet    Refill:   0    Order Specific Question:   Supervising Provider    Answer:   Florian Buff [[id number]]      Plan:    Can use lidocaine gel if desired and can use cold compress  Return 7/1 for pap and physical and recheck

## 2020-03-19 LAB — HERPES SIMPLEX VIRUS CULTURE

## 2020-04-07 ENCOUNTER — Other Ambulatory Visit (HOSPITAL_COMMUNITY)
Admission: RE | Admit: 2020-04-07 | Discharge: 2020-04-07 | Disposition: A | Discharge status: Home / Self Care | Payer: 59 | Source: Ambulatory Visit | Attending: Adult Health | Admitting: Adult Health

## 2020-04-07 ENCOUNTER — Encounter: Payer: Self-pay | Admitting: Adult Health

## 2020-04-07 ENCOUNTER — Ambulatory Visit (INDEPENDENT_AMBULATORY_CARE_PROVIDER_SITE_OTHER): Payer: 59 | Admitting: Adult Health

## 2020-04-07 VITALS — BP 102/68 | HR 83 | Ht 62.0 in | Wt 200.0 lb

## 2020-04-07 DIAGNOSIS — Z01419 Encounter for gynecological examination (general) (routine) without abnormal findings: Secondary | ICD-10-CM

## 2020-04-07 NOTE — Progress Notes (Signed)
Patient ID: Anne [name], female   DOB: 02-08-1986, 34 y.o.   MRN: [id number] History of Present Illness: Anne [name] is a 34 year old white female with long time partner, [id number] in for well woman gyn exam and pap, and also recheck area right labial area, feels better.She is nurse at [employer]. She had labs at work, all normal except LDL 120 and vitamin D is low. She uses condoms.  PCP is [place].  Current Medications, Allergies, Past Medical History, Past Surgical History, Family History and Social History were reviewed in [employer] record.     Review of Systems:  Patient denies any headaches, hearing loss, fatigue, blurred vision, shortness of breath, chest pain, abdominal pain, problems with bowel movements, urination, or intercourse. No joint pain or mood swings.   Physical Exam:BP 102/68 (BP Location: Left Arm, Patient Position: Sitting, Cuff Size: Large)   Pulse 83   Ht 5\' 2"  (1.575 m)   Wt 200 lb (90.7 kg)   LMP 03/26/2020   BMI 36.58 kg/m  General:  Well developed, well nourished, no acute distress Skin:  Warm and dry Neck:  Midline trachea, enlarged thyroid, good ROM, no lymphadenopathy Lungs; Clear to auscultation bilaterally Breast:  No dominant palpable mass, retraction, or nipple discharge Cardiovascular: Regular rate and rhythm Abdomen:  Soft, non tender, no hepatosplenomegaly Pelvic:  External genitalia is normal in appearance, no lesions.  The vagina is normal in appearance,has <1 cm area right inner labia puffy, but not tender. Urethra has no lesions or masses. The cervix is bulbous.Pap with high risk HPV 16/18 genotyping performed.  Uterus is felt to be normal size, shape, and contour.  No adnexal masses or tenderness noted.Bladder is non tender, no masses felt. Extremities/musculoskeletal:  No swelling or varicosities noted, no clubbing or cyanosis Psych:  No mood changes, alert and cooperative,seems happy AA is 1 Fall risk is low PHQ 9 score is  3, no SI, declines meds just stress,partner working a lot  [employer] - 04/07/20 0916      Pregnancy Intention Screening   Does the patient want to become pregnant in the next year? No    Does the patient's partner want to become pregnant in the next year? No    Would the patient like to discuss contraceptive options today? No      Contraception Wrap Up   Current Method Female Condom    Contraception Counseling Provided No          Examination chaperoned by Levy Pupa LPN  Impression and Plan:  1. Encounter for gynecological examination with Papanicolaou smear of cervix Pap sent Physical with PCP Pap in 3 years if normal Labs with PCP Get mammogram at 38 To get thyroid US in near future with PCP Will do Empower screening today  Call prn problems or concerns

## 2020-04-08 ENCOUNTER — Encounter: Payer: Self-pay | Admitting: Adult Health

## 2020-04-08 LAB — CYTOLOGY - PAP
Adequacy: ABSENT
Chlamydia: NEGATIVE
Comment: NEGATIVE
Comment: NEGATIVE
Comment: NORMAL
Diagnosis: NEGATIVE
High risk HPV: NEGATIVE
Neisseria Gonorrhea: NEGATIVE

## 2020-04-20 ENCOUNTER — Telehealth: Payer: Self-pay | Admitting: Adult Health

## 2020-04-20 NOTE — Telephone Encounter (Signed)
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

## 2020-04-29 ENCOUNTER — Other Ambulatory Visit: Payer: Self-pay | Admitting: Adult Health

## 2020-07-01 ENCOUNTER — Other Ambulatory Visit: Payer: Self-pay | Admitting: Physician Assistant

## 2020-07-01 ENCOUNTER — Ambulatory Visit
Admission: RE | Admit: 2020-07-01 | Discharge: 2020-07-01 | Disposition: A | Discharge status: Home / Self Care | Payer: No Typology Code available for payment source | Source: Ambulatory Visit | Attending: Physician Assistant | Admitting: Physician Assistant

## 2020-07-01 DIAGNOSIS — R0789 Other chest pain: Secondary | ICD-10-CM

## 2020-07-01 DIAGNOSIS — Z6832 Body mass index (BMI) 32.0-32.9, adult: Secondary | ICD-10-CM

## 2022-07-02 DIAGNOSIS — E6609 Other obesity due to excess calories: Secondary | ICD-10-CM | POA: Diagnosis not present

## 2022-07-02 DIAGNOSIS — D1724 Benign lipomatous neoplasm of skin and subcutaneous tissue of left leg: Secondary | ICD-10-CM | POA: Diagnosis not present

## 2022-07-02 DIAGNOSIS — Z6836 Body mass index (BMI) 36.0-36.9, adult: Secondary | ICD-10-CM | POA: Diagnosis not present

## 2022-07-02 DIAGNOSIS — M25552 Pain in left hip: Secondary | ICD-10-CM | POA: Diagnosis not present

## 2022-07-06 ENCOUNTER — Other Ambulatory Visit: Payer: Self-pay | Admitting: Family Medicine

## 2022-07-06 ENCOUNTER — Ambulatory Visit
Admission: RE | Admit: 2022-07-06 | Discharge: 2022-07-06 | Disposition: A | Discharge status: Home / Self Care | Payer: No Typology Code available for payment source | Source: Ambulatory Visit | Attending: Family Medicine | Admitting: Family Medicine

## 2022-07-06 DIAGNOSIS — M25552 Pain in left hip: Secondary | ICD-10-CM

## 2022-07-18 DIAGNOSIS — Z20822 Contact with and (suspected) exposure to covid-19: Secondary | ICD-10-CM | POA: Diagnosis not present

## 2022-08-21 ENCOUNTER — Encounter (HOSPITAL_COMMUNITY): Payer: Self-pay | Admitting: Family Medicine

## 2022-08-21 DIAGNOSIS — D1724 Benign lipomatous neoplasm of skin and subcutaneous tissue of left leg: Secondary | ICD-10-CM | POA: Diagnosis not present

## 2022-08-21 DIAGNOSIS — M25552 Pain in left hip: Secondary | ICD-10-CM | POA: Diagnosis not present

## 2022-08-22 ENCOUNTER — Other Ambulatory Visit: Payer: Self-pay | Admitting: Family Medicine

## 2022-08-22 DIAGNOSIS — D1724 Benign lipomatous neoplasm of skin and subcutaneous tissue of left leg: Secondary | ICD-10-CM

## 2022-08-22 DIAGNOSIS — M25552 Pain in left hip: Secondary | ICD-10-CM

## 2022-08-29 DIAGNOSIS — M25552 Pain in left hip: Secondary | ICD-10-CM | POA: Diagnosis not present

## 2022-08-29 DIAGNOSIS — Z0000 Encounter for general adult medical examination without abnormal findings: Secondary | ICD-10-CM | POA: Diagnosis not present

## 2022-09-18 ENCOUNTER — Other Ambulatory Visit: Payer: No Typology Code available for payment source

## 2022-10-17 DIAGNOSIS — N390 Urinary tract infection, site not specified: Secondary | ICD-10-CM | POA: Diagnosis not present

## 2022-10-17 DIAGNOSIS — R319 Hematuria, unspecified: Secondary | ICD-10-CM | POA: Diagnosis not present

## 2022-12-28 ENCOUNTER — Other Ambulatory Visit (HOSPITAL_COMMUNITY): Payer: Self-pay | Admitting: Surgery

## 2022-12-28 DIAGNOSIS — E282 Polycystic ovarian syndrome: Secondary | ICD-10-CM | POA: Diagnosis not present

## 2022-12-28 DIAGNOSIS — L732 Hidradenitis suppurativa: Secondary | ICD-10-CM | POA: Diagnosis not present

## 2023-01-02 DIAGNOSIS — Z0001 Encounter for general adult medical examination with abnormal findings: Secondary | ICD-10-CM | POA: Diagnosis not present

## 2023-01-02 DIAGNOSIS — R7309 Other abnormal glucose: Secondary | ICD-10-CM | POA: Diagnosis not present

## 2023-01-02 DIAGNOSIS — Z01818 Encounter for other preprocedural examination: Secondary | ICD-10-CM | POA: Diagnosis not present

## 2023-01-02 DIAGNOSIS — E782 Mixed hyperlipidemia: Secondary | ICD-10-CM | POA: Diagnosis not present

## 2023-01-03 DIAGNOSIS — M25551 Pain in right hip: Secondary | ICD-10-CM | POA: Diagnosis not present

## 2023-01-03 DIAGNOSIS — E782 Mixed hyperlipidemia: Secondary | ICD-10-CM | POA: Diagnosis not present

## 2023-01-03 DIAGNOSIS — Z6841 Body Mass Index (BMI) 40.0 and over, adult: Secondary | ICD-10-CM | POA: Diagnosis not present

## 2023-01-03 DIAGNOSIS — M545 Low back pain, unspecified: Secondary | ICD-10-CM | POA: Diagnosis not present

## 2023-01-03 DIAGNOSIS — R7309 Other abnormal glucose: Secondary | ICD-10-CM | POA: Diagnosis not present

## 2023-01-03 DIAGNOSIS — M25552 Pain in left hip: Secondary | ICD-10-CM | POA: Diagnosis not present

## 2023-01-03 DIAGNOSIS — E282 Polycystic ovarian syndrome: Secondary | ICD-10-CM | POA: Diagnosis not present

## 2023-01-09 DIAGNOSIS — Z01818 Encounter for other preprocedural examination: Secondary | ICD-10-CM | POA: Diagnosis not present

## 2023-01-09 DIAGNOSIS — F4320 Adjustment disorder, unspecified: Secondary | ICD-10-CM | POA: Diagnosis not present

## 2023-01-21 ENCOUNTER — Encounter: Payer: Self-pay | Admitting: Dietician

## 2023-01-21 ENCOUNTER — Encounter: Payer: BC Managed Care – PPO | Attending: Surgery | Admitting: Dietician

## 2023-01-21 VITALS — Ht 62.5 in | Wt 235.6 lb

## 2023-01-21 DIAGNOSIS — E669 Obesity, unspecified: Secondary | ICD-10-CM | POA: Insufficient documentation

## 2023-01-21 NOTE — Progress Notes (Addendum)
Nutrition Assessment for Bariatric Surgery: Pre-Surgery Behavioral and Nutrition Intervention Program   Medical Nutrition Therapy  Appt Start Time: 1:50    End Time: 3:15  Patient was seen on 01/21/2023 for Pre-Operative Nutrition Assessment. Purpose of todays visit  enhance perioperative outcomes along with a healthy weight maintenance   Referral stated Supervised Weight Loss (SWL) visits needed: 0  Pt completed visits.   Pt has cleared nutrition requirements.   Planned surgery: Sleeve Gastrectomy Pt expectation of surgery: to be below 140-150 lbs, states surgeons goal is 170 lbs. (<30 BMI)   NUTRITION ASSESSMENT   Anthropometrics  Start weight at [facility]: 235.6 lbs (date: 01/21/2023)  Height: 62.5 in BMI: 42.41 kg/m2     Clinical   Pharmacotherapy: History of weight loss medication used: phentermine (on and off), contrave, drexelone, topiramate/ phentermine  Medical hx: PCOS, gestational diabetes,  Medications: none  Labs: Not in EMR (from pts record at her job): Vit D 23.4; A1c 5.7; Cholesterol 210; LDL 141; glucose 109 Notable signs/symptoms: none noted Any previous deficiencies? No  Evaluation of Nutritional Deficiencies: Micronutrient Nutrition Focused Physical Exam: Hair: No issues observed Eyes: No issues observed Mouth: No issues observed Neck: No issues observed Nails: No issues observed Skin: No issues observed  Lifestyle & Dietary Hx  Pt states she has an abdominal hernia from C-section, stating she has not done crunches in a while. Pt states she really loses weight when she is physically active. Pt states she is currently staying below 25 grams of carbohydrates right now. Pt states she practices intermittent fasting, stating she will eat dinner around 6-7 pm and will not eat until 12 (noon) the next day. Pt states she does not get satisfaction from food. Pt states, of the pre-op goals, the one she will need to focus on is slowing down while  eating.  Current Physical Activity Recommendations state 150 minutes per week of moderate to vigorous movement including Cardio and 1-2 days of resistance activities as well as flexibility/balance activities:  Pts current physical activity: walking 1 hour 3-5 days a week with light weights, with 100% recommendation reached   Sleep Hygiene: duration and quality: 4 hours a night, stating she does not sleep much.  Current Patient Perceived Stress Level as stated by pt on a scale of 1-10:  3-4       Stress Management Techniques: read, breathing exercises, step away.  According to the Dietary Guidelines for Americans Recommendation: equivalent 1.5-2 cups fruits per day, equivalent 2-3 cups vegetables per day and at least half all grains whole  Fruit servings per day (on average): 0, meeting 0% recommendation  Non-starchy vegetable servings per day (on average): 2-3, meeting 100% recommendation  Whole Grains per day (on average): 0  Number of meals missed/skipped per week out of 21: 7  24-Hr Dietary Recall First Meal: skip (protein shakes on vacation) Snack: cucumbers with cream cheese or pepperoni Second Meal: salad with low carb poppy seed dressing and chicken Snack: pepperoni with mozzarella cheese sticks Third Meal: pork chop, green bean, cauliflower mac and cheese  Snack: pepperoni with mozzarella cheese sticks Beverages: coffee, water, water with flavorings/drops  Alcoholic beverages per week: 0 (occasionally a drink)   Estimated Energy Needs Calories: 1500  NUTRITION DIAGNOSIS  Overweight/obesity ([place]-3.3) related to past poor dietary habits and physical inactivity as evidenced by patient w/ planned sleeve surgery following dietary guidelines for continued weight loss.  NUTRITION INTERVENTION  Nutrition counseling (C-1) and education (E-2) to facilitate bariatric surgery goals.  Educated pt on micronutrient deficiencies post-surgery and behavioral/dietary strategies to start in  order to mitigate that risk   Behavioral and Dietary Interventions Pre-Op Goals Reviewed with the Patient Nutrition: Healthy Eating Behaviors Switch to non-caloric, non-carbonated and non-caffeinated beverages such as  water, unsweetened tea, Crystal Light and zero calorie beverages (aim for 64 oz. per day) Cut out grazing between meals or at night  Find a protein shake you like Eat every 3-5 hours        Eliminate distractions while eating (TV, computer, reading, driving, texting) Take 16-10 minutes to eat a meal  Decrease high sugar foods/decrease high fat/fried foods Eliminate alcoholic beverages Increase protein intake (eggs, fish, chicken, yogurt) before surgery Eat non starchy vegetables 2 times a day 7 days a week Eat complex carbohydrates such as whole grains and fruits   Behavioral Modification: Physical Activity Increase my usual daily activity (use stairs, park farther, etc.) Engage in _______________________  activity  _______ minutes ______ times per week  Other:    _________________________________________________________________     Problem Solving I will think about my usual eating patterns and how to tweak them How can my friends and family support me Barriers to starting my changes Learn and understand appetite verses hunger   Healthy Coping Allow for ___________ activities per week to help me manage stress Reframe negative thoughts I will keep a picture of someone or something that is my inspiration & look at it daily   Monitoring  Weigh myself once a week  Measure my progress by monitoring how my clothes fit Keep a food record of what I eat and drink for the next ________ (time period) Take pictures of what I eat and drink for the next ________ (time period) Use an app to count steps/day for the next_______ (time period) Measure my progress such as increased energy and more restful sleep Monitor your acid reflux and bowel habits, are they getting better?    *Goals that are bolded indicate the pt would like to start working towards these  Handouts Provided Include  Bariatric Surgery handouts (Nutrition Visits, Pre Surgery Behavioral Change Goals, Protein Shakes Brands to Choose From, Vitamins & Mineral Supplementation)  Learning Style & Readiness for Change Teaching method utilized: Visual, Auditory, and hands on  Demonstrated degree of understanding via: Teach Back  Readiness Level: preparation Barriers to learning/adherence to lifestyle change: busy work schedule  RD's Notes for Next Visit     MONITORING & EVALUATION Dietary intake, weekly physical activity, body weight, and preoperative behavioral change goals   Next Steps  Pt has completed visits. No further supervised visits required/recommended. Patient is to follow up at [facility] for pre-op class, >2 weeks prior to scheduled surgery.

## 2023-02-18 ENCOUNTER — Other Ambulatory Visit: Payer: Self-pay | Admitting: Surgery

## 2023-02-25 ENCOUNTER — Ambulatory Visit
Admission: RE | Admit: 2023-02-25 | Discharge: 2023-02-25 | Disposition: A | Discharge status: Home / Self Care | Payer: BC Managed Care – PPO | Source: Ambulatory Visit | Attending: Surgery | Admitting: Surgery

## 2023-02-25 ENCOUNTER — Other Ambulatory Visit: Payer: Self-pay | Admitting: Surgery

## 2023-02-26 DIAGNOSIS — R1013 Epigastric pain: Secondary | ICD-10-CM | POA: Diagnosis not present

## 2023-03-08 DIAGNOSIS — L501 Idiopathic urticaria: Secondary | ICD-10-CM | POA: Diagnosis not present

## 2023-03-08 DIAGNOSIS — Z6841 Body Mass Index (BMI) 40.0 and over, adult: Secondary | ICD-10-CM | POA: Diagnosis not present

## 2023-03-11 ENCOUNTER — Encounter: Payer: BC Managed Care – PPO | Attending: Surgery | Admitting: Skilled Nursing Facility1

## 2023-03-11 ENCOUNTER — Encounter: Payer: Self-pay | Admitting: Skilled Nursing Facility1

## 2023-03-11 VITALS — Wt 235.6 lb

## 2023-03-11 DIAGNOSIS — E669 Obesity, unspecified: Secondary | ICD-10-CM | POA: Insufficient documentation

## 2023-03-11 NOTE — Progress Notes (Signed)
Pre-Operative Nutrition Class:    Patient was seen on 03/11/2023 for Pre-Operative Bariatric Surgery Education [facility]    Surgery date: 04/02/2023 Surgery type: sleeve  Start weight at [facility]: 234.7 Weight today: 235.6   The following the learning objectives were met by the patient during this course: Identify Pre-Op Dietary Goals and will begin 2 weeks pre-operatively Identify appropriate sources of fluids and proteins  State protein recommendations and appropriate sources pre and post-operatively Identify Post-Operative Dietary Goals and will follow for 2 weeks post-operatively Identify appropriate multivitamin and calcium sources Describe the need for physical activity post-operatively and will follow MD recommendations State when to call healthcare provider regarding medication questions or post-operative complications When having a diagnosis of diabetes understanding hypoglycemia symptoms and the inclusion of 1 complex carbohydrate per meal  Handouts given during class include: Pre-Op Bariatric Surgery Diet Handout Protein Shake Handout Post-Op Bariatric Surgery Nutrition Handout BELT Program Information Flyer Support Group Information Flyer [facility] Bariatric Supplements Price List  Follow-Up Plan: Patient will follow-up at [facility] 2 weeks post operatively for diet advancement per MD.

## 2023-03-12 ENCOUNTER — Ambulatory Visit: Payer: Self-pay | Admitting: Surgery

## 2023-03-22 DIAGNOSIS — E282 Polycystic ovarian syndrome: Secondary | ICD-10-CM | POA: Diagnosis not present

## 2023-03-22 DIAGNOSIS — L732 Hidradenitis suppurativa: Secondary | ICD-10-CM | POA: Diagnosis not present

## 2023-04-02 ENCOUNTER — Inpatient Hospital Stay (HOSPITAL_COMMUNITY): Admission: RE | Admit: 2023-04-02 | Payer: BC Managed Care – PPO | Source: Home / Self Care | Admitting: Surgery

## 2023-04-02 ENCOUNTER — Encounter (HOSPITAL_COMMUNITY): Admission: RE | Payer: Self-pay | Source: Home / Self Care

## 2023-04-02 SURGERY — GASTRECTOMY, SLEEVE, LAPAROSCOPIC
Anesthesia: General

## 2023-11-12 DIAGNOSIS — J029 Acute pharyngitis, unspecified: Secondary | ICD-10-CM | POA: Diagnosis not present

## 2024-01-31 DIAGNOSIS — N201 Calculus of ureter: Secondary | ICD-10-CM | POA: Diagnosis not present

## 2024-01-31 DIAGNOSIS — R319 Hematuria, unspecified: Secondary | ICD-10-CM | POA: Diagnosis not present

## 2024-01-31 DIAGNOSIS — R1032 Left lower quadrant pain: Secondary | ICD-10-CM | POA: Diagnosis not present

## 2024-01-31 DIAGNOSIS — Z6841 Body Mass Index (BMI) 40.0 and over, adult: Secondary | ICD-10-CM | POA: Diagnosis not present

## 2024-01-31 DIAGNOSIS — E282 Polycystic ovarian syndrome: Secondary | ICD-10-CM | POA: Diagnosis not present

## 2024-02-19 DIAGNOSIS — E282 Polycystic ovarian syndrome: Secondary | ICD-10-CM | POA: Diagnosis not present

## 2024-02-19 DIAGNOSIS — Z6841 Body Mass Index (BMI) 40.0 and over, adult: Secondary | ICD-10-CM | POA: Diagnosis not present

## 2024-02-19 DIAGNOSIS — E782 Mixed hyperlipidemia: Secondary | ICD-10-CM | POA: Diagnosis not present

## 2024-02-19 DIAGNOSIS — M545 Low back pain, unspecified: Secondary | ICD-10-CM | POA: Diagnosis not present

## 2024-06-12 DIAGNOSIS — Z23 Encounter for immunization: Secondary | ICD-10-CM | POA: Diagnosis not present

## 2024-07-04 ENCOUNTER — Other Ambulatory Visit: Payer: Self-pay | Admitting: Medical Genetics

## 2024-10-27 ENCOUNTER — Other Ambulatory Visit: Payer: Self-pay | Admitting: Medical Genetics

## 2024-10-27 DIAGNOSIS — Z006 Encounter for examination for normal comparison and control in clinical research program: Secondary | ICD-10-CM
